# Patient Record
Sex: Female | Born: 1960 | Race: White | Hispanic: No | Marital: Single | Smoking: Current some day smoker
Health system: Southern US, Community
[De-identification: ages and names within clinical notes are randomized; demographics above are authoritative.]

## PROBLEM LIST (undated history)

## (undated) DIAGNOSIS — C801 Malignant (primary) neoplasm, unspecified: Secondary | ICD-10-CM

## (undated) HISTORY — PX: OTHER SURGICAL HISTORY: SHX169

---

## 2015-02-27 ENCOUNTER — Inpatient Hospital Stay (HOSPITAL_COMMUNITY): Payer: Medicaid Other

## 2015-02-27 ENCOUNTER — Inpatient Hospital Stay (HOSPITAL_COMMUNITY): Payer: Medicaid Other | Admitting: Anesthesiology

## 2015-02-27 ENCOUNTER — Emergency Department (HOSPITAL_COMMUNITY): Payer: Medicaid Other

## 2015-02-27 ENCOUNTER — Inpatient Hospital Stay (HOSPITAL_COMMUNITY)
Admission: EM | Admit: 2015-02-27 | Discharge: 2015-03-09 | DRG: 871 | Disposition: A | Discharge status: Home Health | Payer: Medicaid Other | Attending: Internal Medicine | Admitting: Internal Medicine

## 2015-02-27 ENCOUNTER — Encounter (HOSPITAL_COMMUNITY): Payer: Self-pay | Admitting: Emergency Medicine

## 2015-02-27 DIAGNOSIS — N179 Acute kidney failure, unspecified: Secondary | ICD-10-CM | POA: Diagnosis present

## 2015-02-27 DIAGNOSIS — A419 Sepsis, unspecified organism: Principal | ICD-10-CM | POA: Diagnosis present

## 2015-02-27 DIAGNOSIS — F1910 Other psychoactive substance abuse, uncomplicated: Secondary | ICD-10-CM | POA: Diagnosis present

## 2015-02-27 DIAGNOSIS — J90 Pleural effusion, not elsewhere classified: Secondary | ICD-10-CM | POA: Diagnosis present

## 2015-02-27 DIAGNOSIS — D696 Thrombocytopenia, unspecified: Secondary | ICD-10-CM | POA: Diagnosis present

## 2015-02-27 DIAGNOSIS — T501X5A Adverse effect of loop [high-ceiling] diuretics, initial encounter: Secondary | ICD-10-CM | POA: Diagnosis not present

## 2015-02-27 DIAGNOSIS — L8992 Pressure ulcer of unspecified site, stage 2: Secondary | ICD-10-CM | POA: Diagnosis present

## 2015-02-27 DIAGNOSIS — J154 Pneumonia due to other streptococci: Secondary | ICD-10-CM | POA: Diagnosis present

## 2015-02-27 DIAGNOSIS — C8333 Diffuse large B-cell lymphoma, intra-abdominal lymph nodes: Secondary | ICD-10-CM | POA: Diagnosis present

## 2015-02-27 DIAGNOSIS — B1920 Unspecified viral hepatitis C without hepatic coma: Secondary | ICD-10-CM | POA: Diagnosis present

## 2015-02-27 DIAGNOSIS — L89152 Pressure ulcer of sacral region, stage 2: Secondary | ICD-10-CM | POA: Diagnosis present

## 2015-02-27 DIAGNOSIS — R6521 Severe sepsis with septic shock: Secondary | ICD-10-CM | POA: Diagnosis present

## 2015-02-27 DIAGNOSIS — C8590 Non-Hodgkin lymphoma, unspecified, unspecified site: Secondary | ICD-10-CM

## 2015-02-27 DIAGNOSIS — D638 Anemia in other chronic diseases classified elsewhere: Secondary | ICD-10-CM | POA: Diagnosis present

## 2015-02-27 DIAGNOSIS — F1120 Opioid dependence, uncomplicated: Secondary | ICD-10-CM | POA: Diagnosis present

## 2015-02-27 DIAGNOSIS — J9601 Acute respiratory failure with hypoxia: Secondary | ICD-10-CM | POA: Diagnosis present

## 2015-02-27 DIAGNOSIS — D709 Neutropenia, unspecified: Secondary | ICD-10-CM | POA: Diagnosis present

## 2015-02-27 DIAGNOSIS — C8330 Diffuse large B-cell lymphoma, unspecified site: Secondary | ICD-10-CM | POA: Diagnosis not present

## 2015-02-27 DIAGNOSIS — R188 Other ascites: Secondary | ICD-10-CM | POA: Diagnosis present

## 2015-02-27 DIAGNOSIS — R579 Shock, unspecified: Secondary | ICD-10-CM | POA: Diagnosis present

## 2015-02-27 DIAGNOSIS — J9600 Acute respiratory failure, unspecified whether with hypoxia or hypercapnia: Secondary | ICD-10-CM

## 2015-02-27 DIAGNOSIS — R0600 Dyspnea, unspecified: Secondary | ICD-10-CM

## 2015-02-27 DIAGNOSIS — R0602 Shortness of breath: Secondary | ICD-10-CM

## 2015-02-27 DIAGNOSIS — J189 Pneumonia, unspecified organism: Secondary | ICD-10-CM

## 2015-02-27 DIAGNOSIS — E2740 Unspecified adrenocortical insufficiency: Secondary | ICD-10-CM | POA: Diagnosis present

## 2015-02-27 DIAGNOSIS — Y95 Nosocomial condition: Secondary | ICD-10-CM | POA: Diagnosis present

## 2015-02-27 DIAGNOSIS — Z9889 Other specified postprocedural states: Secondary | ICD-10-CM

## 2015-02-27 DIAGNOSIS — R0603 Acute respiratory distress: Secondary | ICD-10-CM

## 2015-02-27 DIAGNOSIS — L8990 Pressure ulcer of unspecified site, unspecified stage: Secondary | ICD-10-CM

## 2015-02-27 DIAGNOSIS — N19 Unspecified kidney failure: Secondary | ICD-10-CM | POA: Diagnosis not present

## 2015-02-27 DIAGNOSIS — D630 Anemia in neoplastic disease: Secondary | ICD-10-CM | POA: Diagnosis present

## 2015-02-27 DIAGNOSIS — F17210 Nicotine dependence, cigarettes, uncomplicated: Secondary | ICD-10-CM | POA: Diagnosis present

## 2015-02-27 DIAGNOSIS — E874 Mixed disorder of acid-base balance: Secondary | ICD-10-CM | POA: Diagnosis present

## 2015-02-27 DIAGNOSIS — R52 Pain, unspecified: Secondary | ICD-10-CM

## 2015-02-27 DIAGNOSIS — F1190 Opioid use, unspecified, uncomplicated: Secondary | ICD-10-CM | POA: Diagnosis present

## 2015-02-27 DIAGNOSIS — R509 Fever, unspecified: Secondary | ICD-10-CM

## 2015-02-27 DIAGNOSIS — E876 Hypokalemia: Secondary | ICD-10-CM | POA: Diagnosis not present

## 2015-02-27 DIAGNOSIS — F1110 Opioid abuse, uncomplicated: Secondary | ICD-10-CM | POA: Diagnosis not present

## 2015-02-27 DIAGNOSIS — J9690 Respiratory failure, unspecified, unspecified whether with hypoxia or hypercapnia: Secondary | ICD-10-CM

## 2015-02-27 DIAGNOSIS — L89322 Pressure ulcer of left buttock, stage 2: Secondary | ICD-10-CM | POA: Diagnosis present

## 2015-02-27 DIAGNOSIS — R109 Unspecified abdominal pain: Secondary | ICD-10-CM | POA: Diagnosis present

## 2015-02-27 HISTORY — DX: Malignant (primary) neoplasm, unspecified: C80.1

## 2015-02-27 LAB — BLOOD GAS, ARTERIAL
Acid-base deficit: 7.4 mmol/L — ABNORMAL HIGH (ref 0.0–2.0)
Acid-base deficit: 6.7 mmol/L — ABNORMAL HIGH (ref 0.0–2.0)
Bicarbonate: 14.2 mEq/L — ABNORMAL LOW (ref 20.0–24.0)
Bicarbonate: 16.7 mEq/L — ABNORMAL LOW (ref 20.0–24.0)
DRAWN BY: 308601
DRAWN BY: 295031
FIO2: 1
FIO2: 1
O2 SAT: 98.5 %
O2 Saturation: 98.9 %
PATIENT TEMPERATURE: 98.6
PCO2 ART: 19.3 mmHg — AB (ref 35.0–45.0)
PCO2 ART: 28.4 mmHg — AB (ref 35.0–45.0)
PEEP: 8 cmH2O
PH ART: 7.48 — AB (ref 7.350–7.450)
PH ART: 7.388 (ref 7.350–7.450)
Patient temperature: 98.6
RATE: 24 resp/min
TCO2: 12.7 mmol/L (ref 0–100)
TCO2: 15.3 mmol/L (ref 0–100)
VT: 500 mL
pO2, Arterial: 140 mmHg — ABNORMAL HIGH (ref 80.0–100.0)
pO2, Arterial: 147 mmHg — ABNORMAL HIGH (ref 80.0–100.0)

## 2015-02-27 LAB — LACTIC ACID, PLASMA
LACTIC ACID, VENOUS: 2.9 mmol/L — AB (ref 0.5–2.0)
LACTIC ACID, VENOUS: 3.7 mmol/L — AB (ref 0.5–2.0)
Lactic Acid, Venous: 9.3 mmol/L (ref 0.5–2.0)

## 2015-02-27 LAB — STREP PNEUMONIAE URINARY ANTIGEN: Strep Pneumo Urinary Antigen: POSITIVE — AB

## 2015-02-27 LAB — BASIC METABOLIC PANEL
ANION GAP: 12 (ref 5–15)
BUN: 21 mg/dL — ABNORMAL HIGH (ref 6–20)
CO2: 16 mmol/L — AB (ref 22–32)
Calcium: 6.6 mg/dL — ABNORMAL LOW (ref 8.9–10.3)
Chloride: 107 mmol/L (ref 101–111)
Creatinine, Ser: 0.99 mg/dL (ref 0.44–1.00)
GLUCOSE: 116 mg/dL — AB (ref 65–99)
POTASSIUM: 2.6 mmol/L — AB (ref 3.5–5.1)
Sodium: 135 mmol/L (ref 135–145)

## 2015-02-27 LAB — URINE MICROSCOPIC-ADD ON

## 2015-02-27 LAB — URINALYSIS, ROUTINE W REFLEX MICROSCOPIC
Glucose, UA: NEGATIVE mg/dL
KETONES UR: NEGATIVE mg/dL
LEUKOCYTES UA: NEGATIVE
NITRITE: NEGATIVE
PROTEIN: 30 mg/dL — AB
Specific Gravity, Urine: 1.015 (ref 1.005–1.030)
UROBILINOGEN UA: 1 mg/dL (ref 0.0–1.0)
pH: 5.5 (ref 5.0–8.0)

## 2015-02-27 LAB — RAPID URINE DRUG SCREEN, HOSP PERFORMED
AMPHETAMINES: NOT DETECTED
BENZODIAZEPINES: POSITIVE — AB
Barbiturates: NOT DETECTED
COCAINE: NOT DETECTED
OPIATES: NOT DETECTED
Tetrahydrocannabinol: NOT DETECTED

## 2015-02-27 LAB — COMPREHENSIVE METABOLIC PANEL
ALBUMIN: 2.7 g/dL — AB (ref 3.5–5.0)
ALK PHOS: 66 U/L (ref 38–126)
ALT: 21 U/L (ref 14–54)
ANION GAP: 21 — AB (ref 5–15)
AST: 47 U/L — ABNORMAL HIGH (ref 15–41)
BILIRUBIN TOTAL: 1 mg/dL (ref 0.3–1.2)
BUN: 22 mg/dL — ABNORMAL HIGH (ref 6–20)
CALCIUM: 8.2 mg/dL — AB (ref 8.9–10.3)
CO2: 15 mmol/L — AB (ref 22–32)
CREATININE: 1.88 mg/dL — AB (ref 0.44–1.00)
Chloride: 100 mmol/L — ABNORMAL LOW (ref 101–111)
GFR calc non Af Amer: 30 mL/min — ABNORMAL LOW (ref >60)
GFR, EST AFRICAN AMERICAN: 34 mL/min — AB (ref >60)
GLUCOSE: 119 mg/dL — AB (ref 65–99)
Potassium: 3.1 mmol/L — ABNORMAL LOW (ref 3.5–5.1)
SODIUM: 136 mmol/L (ref 135–145)
TOTAL PROTEIN: 5.1 g/dL — AB (ref 6.5–8.1)

## 2015-02-27 LAB — LIPASE, BLOOD: Lipase: <10 U/L — ABNORMAL LOW (ref 22–51)

## 2015-02-27 LAB — CARBOXYHEMOGLOBIN
Carboxyhemoglobin: 0.8 % (ref 0.5–1.5)
Carboxyhemoglobin: 1 % (ref 0.5–1.5)
METHEMOGLOBIN: 0.9 % (ref 0.0–1.5)
Methemoglobin: 0.7 % (ref 0.0–1.5)
O2 SAT: 60.2 %
O2 Saturation: 75.8 %
TOTAL HEMOGLOBIN: 11.2 g/dL — AB (ref 12.0–16.0)
TOTAL HEMOGLOBIN: 10.1 g/dL — AB (ref 12.0–16.0)

## 2015-02-27 LAB — PROTIME-INR
INR: 1.36 (ref 0.00–1.49)
Prothrombin Time: 16.9 seconds — ABNORMAL HIGH (ref 11.6–15.2)

## 2015-02-27 LAB — CBC WITH DIFFERENTIAL/PLATELET
BASOS PCT: 1 %
Basophils Absolute: 0 10*3/uL (ref 0.0–0.1)
EOS ABS: 0 10*3/uL (ref 0.0–0.7)
Eosinophils Relative: 2 %
HEMATOCRIT: 36.3 % (ref 36.0–46.0)
HEMOGLOBIN: 11.5 g/dL — AB (ref 12.0–15.0)
LYMPHS PCT: 85 %
Lymphs Abs: 0.8 10*3/uL (ref 0.7–4.0)
MCH: 26.7 pg (ref 26.0–34.0)
MCHC: 31.7 g/dL (ref 30.0–36.0)
MCV: 84.2 fL (ref 78.0–100.0)
MONOS PCT: 10 %
Monocytes Absolute: 0.1 10*3/uL (ref 0.1–1.0)
NEUTROS ABS: 0 10*3/uL — AB (ref 1.7–7.7)
NEUTROS PCT: 2 %
Platelets: 112 10*3/uL — ABNORMAL LOW (ref 150–400)
RBC: 4.31 MIL/uL (ref 3.87–5.11)
RDW: 15.4 % (ref 11.5–15.5)
WBC: 0.9 10*3/uL — CL (ref 4.0–10.5)

## 2015-02-27 LAB — TYPE AND SCREEN
ABO/RH(D): A POS
ANTIBODY SCREEN: NEGATIVE

## 2015-02-27 LAB — PROCALCITONIN: PROCALCITONIN: 35.35 ng/mL

## 2015-02-27 LAB — APTT: aPTT: 28 seconds (ref 24–37)

## 2015-02-27 LAB — GLUCOSE, CAPILLARY
GLUCOSE-CAPILLARY: 138 mg/dL — AB (ref 65–99)
GLUCOSE-CAPILLARY: 107 mg/dL — AB (ref 65–99)
Glucose-Capillary: 138 mg/dL — ABNORMAL HIGH (ref 65–99)

## 2015-02-27 LAB — CORTISOL: Cortisol, Plasma: 69.2 ug/dL

## 2015-02-27 LAB — ABO/RH: ABO/RH(D): A POS

## 2015-02-27 LAB — PATHOLOGIST SMEAR REVIEW

## 2015-02-27 LAB — PHOSPHORUS: PHOSPHORUS: 3.7 mg/dL (ref 2.5–4.6)

## 2015-02-27 LAB — TROPONIN I: Troponin I: 0.04 ng/mL — ABNORMAL HIGH (ref <0.031)

## 2015-02-27 LAB — BRAIN NATRIURETIC PEPTIDE: B NATRIURETIC PEPTIDE 5: 176.5 pg/mL — AB (ref 0.0–100.0)

## 2015-02-27 LAB — MRSA PCR SCREENING: MRSA BY PCR: NEGATIVE

## 2015-02-27 LAB — MAGNESIUM: Magnesium: 1.2 mg/dL — ABNORMAL LOW (ref 1.7–2.4)

## 2015-02-27 LAB — AMYLASE: AMYLASE: 31 U/L (ref 28–100)

## 2015-02-27 MED ORDER — POTASSIUM CHLORIDE 10 MEQ/100ML IV SOLN
10.0000 meq | INTRAVENOUS | Status: AC
  Administered 2015-02-27 – 2015-02-28 (×4): 10 meq via INTRAVENOUS
  Filled 2015-02-27 (×4): qty 100

## 2015-02-27 MED ORDER — NOREPINEPHRINE BITARTRATE 1 MG/ML IV SOLN
0.0000 ug/min | INTRAVENOUS | Status: DC
  Filled 2015-02-27 (×2): qty 4

## 2015-02-27 MED ORDER — ETOMIDATE 2 MG/ML IV SOLN
INTRAVENOUS | Status: AC
  Administered 2015-02-27: 20 mg
  Filled 2015-02-27: qty 20

## 2015-02-27 MED ORDER — FENTANYL BOLUS VIA INFUSION
50.0000 ug | INTRAVENOUS | Status: DC | PRN
  Filled 2015-02-27: qty 50

## 2015-02-27 MED ORDER — MIDAZOLAM HCL 2 MG/2ML IJ SOLN
2.0000 mg | INTRAMUSCULAR | Status: DC | PRN
  Administered 2015-02-27 – 2015-02-28 (×3): 2 mg via INTRAVENOUS
  Filled 2015-02-27 (×4): qty 2

## 2015-02-27 MED ORDER — SUCCINYLCHOLINE CHLORIDE 20 MG/ML IJ SOLN
INTRAMUSCULAR | Status: AC
  Filled 2015-02-27: qty 1

## 2015-02-27 MED ORDER — DOBUTAMINE IN D5W 4-5 MG/ML-% IV SOLN
5.0000 ug/kg/min | INTRAVENOUS | Status: DC
  Administered 2015-02-27: 5 ug/kg/min via INTRAVENOUS
  Filled 2015-02-27: qty 250

## 2015-02-27 MED ORDER — HYDROCORTISONE NA SUCCINATE PF 100 MG IJ SOLR
100.0000 mg | Freq: Three times a day (TID) | INTRAMUSCULAR | Status: DC
  Administered 2015-02-27 – 2015-02-28 (×4): 100 mg via INTRAVENOUS
  Filled 2015-02-27 (×4): qty 2

## 2015-02-27 MED ORDER — SODIUM CHLORIDE 0.9 % IV BOLUS (SEPSIS)
1000.0000 mL | Freq: Once | INTRAVENOUS | Status: AC
  Administered 2015-02-27: 1000 mL via INTRAVENOUS

## 2015-02-27 MED ORDER — MIDAZOLAM HCL 2 MG/2ML IJ SOLN: 2.0000 mg | INTRAMUSCULAR | Status: DC | PRN

## 2015-02-27 MED ORDER — MIDAZOLAM HCL 2 MG/2ML IJ SOLN
2.0000 mg | Freq: Once | INTRAMUSCULAR | Status: AC
  Administered 2015-02-27: 2 mg via INTRAVENOUS
  Filled 2015-02-27: qty 2

## 2015-02-27 MED ORDER — NOREPINEPHRINE BITARTRATE 1 MG/ML IV SOLN
0.0000 ug/min | Freq: Once | INTRAVENOUS | Status: AC
  Administered 2015-02-27: 2 ug/min via INTRAVENOUS
  Filled 2015-02-27: qty 4

## 2015-02-27 MED ORDER — SODIUM CHLORIDE 0.9 % IV BOLUS (SEPSIS)
2000.0000 mL | Freq: Once | INTRAVENOUS | Status: AC
  Administered 2015-02-27: 2000 mL via INTRAVENOUS

## 2015-02-27 MED ORDER — LIP MEDEX EX OINT
TOPICAL_OINTMENT | CUTANEOUS | Status: AC
  Administered 2015-02-27: 20:00:00
  Filled 2015-02-27: qty 7

## 2015-02-27 MED ORDER — PANTOPRAZOLE SODIUM 40 MG IV SOLR
40.0000 mg | Freq: Every day | INTRAVENOUS | Status: DC
Start: 2015-02-27 — End: 2015-03-06
  Administered 2015-02-27 – 2015-03-05 (×7): 40 mg via INTRAVENOUS
  Filled 2015-02-27 (×7): qty 40

## 2015-02-27 MED ORDER — LIDOCAINE HCL (CARDIAC) 20 MG/ML IV SOLN
INTRAVENOUS | Status: AC
  Filled 2015-02-27: qty 5

## 2015-02-27 MED ORDER — ONDANSETRON HCL 4 MG/2ML IJ SOLN
4.0000 mg | Freq: Four times a day (QID) | INTRAMUSCULAR | Status: DC | PRN
  Administered 2015-02-28: 4 mg via INTRAVENOUS
  Filled 2015-02-27 (×2): qty 2

## 2015-02-27 MED ORDER — FENTANYL CITRATE (PF) 2500 MCG/50ML IJ SOLN
25.0000 ug/h | INTRAMUSCULAR | Status: DC
  Administered 2015-02-27: 100 ug/h via INTRAVENOUS
  Administered 2015-02-28: 375 ug/h via INTRAVENOUS
  Filled 2015-02-27 (×4): qty 50

## 2015-02-27 MED ORDER — SODIUM CHLORIDE 0.9 % IV SOLN
25.0000 ug/h | INTRAVENOUS | Status: DC
  Administered 2015-02-27: 100 ug/h via INTRAVENOUS
  Administered 2015-02-27: 50 ug/h via INTRAVENOUS
  Filled 2015-02-27: qty 50

## 2015-02-27 MED ORDER — PIPERACILLIN-TAZOBACTAM 3.375 G IVPB
3.3750 g | Freq: Once | INTRAVENOUS | Status: AC
  Administered 2015-02-27: 3.375 g via INTRAVENOUS
  Filled 2015-02-27: qty 50

## 2015-02-27 MED ORDER — VASOPRESSIN 20 UNIT/ML IV SOLN
0.0300 [IU]/min | INTRAVENOUS | Status: DC
  Administered 2015-02-27: 0.03 [IU]/min via INTRAVENOUS
  Filled 2015-02-27: qty 2

## 2015-02-27 MED ORDER — CHLORHEXIDINE GLUCONATE 0.12% ORAL RINSE (MEDLINE KIT)
15.0000 mL | Freq: Two times a day (BID) | OROMUCOSAL | Status: DC
  Administered 2015-02-27: 15 mL via OROMUCOSAL

## 2015-02-27 MED ORDER — VANCOMYCIN HCL IN DEXTROSE 1-5 GM/200ML-% IV SOLN
1000.0000 mg | INTRAVENOUS | Status: DC
Start: 2015-02-28 — End: 2015-02-28
  Administered 2015-02-28: 1000 mg via INTRAVENOUS
  Filled 2015-02-27: qty 200

## 2015-02-27 MED ORDER — ROCURONIUM BROMIDE 50 MG/5ML IV SOLN
INTRAVENOUS | Status: AC
  Administered 2015-02-27: 10 mg
  Filled 2015-02-27: qty 2

## 2015-02-27 MED ORDER — FENTANYL CITRATE (PF) 100 MCG/2ML IJ SOLN
200.0000 ug | Freq: Once | INTRAMUSCULAR | Status: AC
  Administered 2015-02-27: 200 ug via INTRAVENOUS
  Filled 2015-02-27: qty 4

## 2015-02-27 MED ORDER — ANTISEPTIC ORAL RINSE SOLUTION (CORINZ)
7.0000 mL | Freq: Four times a day (QID) | OROMUCOSAL | Status: DC
  Administered 2015-02-27: 7 mL via OROMUCOSAL

## 2015-02-27 MED ORDER — PIPERACILLIN-TAZOBACTAM 3.375 G IVPB
3.3750 g | Freq: Three times a day (TID) | INTRAVENOUS | Status: DC
Start: 2015-02-27 — End: 2015-03-06
  Administered 2015-02-27 – 2015-03-06 (×21): 3.375 g via INTRAVENOUS
  Filled 2015-02-27 (×19): qty 50

## 2015-02-27 MED ORDER — DEXTROSE 5 % IV SOLN
0.0000 ug/min | INTRAVENOUS | Status: DC
  Administered 2015-02-27: 12 ug/min via INTRAVENOUS
  Administered 2015-02-28: 7 ug/min via INTRAVENOUS
  Filled 2015-02-27 (×2): qty 16

## 2015-02-27 MED ORDER — HEPARIN SODIUM (PORCINE) 5000 UNIT/ML IJ SOLN
5000.0000 [IU] | Freq: Three times a day (TID) | INTRAMUSCULAR | Status: DC
  Administered 2015-02-27 – 2015-02-28 (×3): 5000 [IU] via SUBCUTANEOUS
  Filled 2015-02-27 (×6): qty 1

## 2015-02-27 MED ORDER — VANCOMYCIN HCL IN DEXTROSE 1-5 GM/200ML-% IV SOLN
1000.0000 mg | Freq: Once | INTRAVENOUS | Status: AC
  Administered 2015-02-27: 1000 mg via INTRAVENOUS
  Filled 2015-02-27: qty 200

## 2015-02-27 MED ORDER — FENTANYL CITRATE (PF) 100 MCG/2ML IJ SOLN
50.0000 ug | Freq: Once | INTRAMUSCULAR | Status: AC
  Administered 2015-02-27: 50 ug via INTRAVENOUS

## 2015-02-27 NOTE — Progress Notes (Signed)
[place] Progress Note Patient Name: Joan Hoover DOB: 1960-07-09 MRN: [id number]   Date of Service  02/27/2015  HPI/Events of Note  Multiple issues: 1. K+ this AM = 3.1 and not replaced and 2. CVP = 7.  eICU Interventions  Will order: 1. BMP now. 2. Bolus with 0.9 NaCl 1 liter IV over 1 hour now.     Intervention Category Major Interventions: Shock - evaluation and management;Sepsis - evaluation and management;Hypotension - evaluation and management  Lysle Dingwall 02/27/2015, 8:57 PM

## 2015-02-27 NOTE — ED Notes (Signed)
Attempted blood draw with no success.     

## 2015-02-27 NOTE — Sedation Documentation (Signed)
Vocal cords visualized. Pt intubated.

## 2015-02-27 NOTE — Sedation Documentation (Signed)
Pt threw up. Pt suction. NP intubating.

## 2015-02-27 NOTE — ED Notes (Signed)
Pt presents from home via EMS for SOB. Daughter reports pt has been having recurrent pleural effusions with repeat thoracentesis. Pt A&O x4, c/o SOB, wet lung sounds.   20g L AC by EMS.

## 2015-02-27 NOTE — ED Notes (Signed)
NP at bedside discussing intubation with pt

## 2015-02-27 NOTE — Procedures (Signed)
Arterial Catheter Insertion Procedure Note Joan Hoover [id number] 1961-04-08  Procedure: Insertion of Arterial Catheter  Indications: Blood pressure monitoring and Frequent blood sampling  Procedure Details Consent: Risks of procedure as well as the alternatives and risks of each were explained to the (patient/caregiver).  Consent for procedure obtained. Time Out: Verified patient identification, verified procedure, site/side was marked, verified correct patient position, special equipment/implants available, medications/allergies/relevent history reviewed, required imaging and test results available.  Performed  Maximum sterile technique was used including antiseptics, cap, gloves, gown, hand hygiene, mask and sheet. Skin prep: Chlorhexidine; local anesthetic administered 20 gauge catheter was inserted into right femoral artery using the Seldinger technique.  Evaluation Blood flow good; BP tracing good. Complications: No apparent complications.   Joan Hoover 02/27/2015 [place] Failed by Joan Hoover. Joan Mould, MD, FACP Pgr: [facility]   Had firbotic tissue

## 2015-02-27 NOTE — Anesthesia Procedure Notes (Signed)
Anesthesia Procedure Note Called to 1231 for placement of A-line pt with restriction to right arm on arrival.  Attempt A-line with sterile procedure on left without success with doppler pre attempt.  Performed evaluation under ultrasound with noted small artery, advised staff to notify MD for evaluation of femoral placement.  Spoke with critical care MD and he would place femoral.  Pt VSS during procedure, advised RN of same.

## 2015-02-27 NOTE — Sedation Documentation (Signed)
Pt being BVM ventilated. Respiratory, NP and MDs at bedside. Pt verbalized consent and identified prior to procedure.

## 2015-02-27 NOTE — Progress Notes (Signed)
CRITICAL VALUE ALERT  Critical value received:  K 2.6  Date of notification:  02/27/2015  Time of notification:  2200  Critical value read back:Yes.    Nurse who received alert:  Kathie Rhodes, RN  MD notified (1st page):  Dr. Oletta Darter  Time of first page:  2205  MD notified (2nd page):  Time of second page:  Responding MD:  Dr. Oletta Darter  Time MD responded:  2206

## 2015-02-27 NOTE — Progress Notes (Addendum)
[facility] Progress Note Patient Name: Ikran Patman DOB: September 10, 1960 MRN: [id number]   Date of Service  02/27/2015  HPI/Events of Note  Coox = 60%. Hgb = 11.2, CVP = 18.0 and BP = 103/75. Anesthesia is attempting A-Line.  eICU Interventions  Will order: 1. Dobutamine IV infusion at 5 mcg/kg/min. 2. Repeat Coox in 1 hour.      Intervention Category Major Interventions: Shock - evaluation and management;Sepsis - evaluation and management  Sommer,Steven Eugene 02/27/2015, 4:14 PM

## 2015-02-27 NOTE — Progress Notes (Signed)
CRITICAL VALUE ALERT  Critical value received:  2.9  Date of notification:  02/27/2015  Time of notification:  1200  Critical value read back:Yes.    Nurse who received alert: Javier Glazier  MD notified (1st page):  Dr. Lake Bells  Time of first page:  1200  MD notified (2nd page):  Time of second page:  Responding MD:  Dr. Lake Bells  Time MD responded:  1200

## 2015-02-27 NOTE — Progress Notes (Signed)
CRITICAL VALUE ALERT  Critical value received:  scvo2  Date of notification:  02/27/2015  Time of notification:  0539  Critical value read back:Yes.    Nurse who received alert:  Javier Glazier  MD notified (1st page):  Sommers  Time of first page:  16  MD notified (2nd page):  Time of second page:  Responding MD:  Emmit Alexanders  Time MD responded:  7673  MD ordered dobutamine drip and repeat scvo2

## 2015-02-27 NOTE — Procedures (Signed)
Central Venous Catheter Insertion Procedure Note Joan Hoover [id number] 06-Sep-1960  Procedure: Insertion of Central Venous Catheter Indications: Assessment of intravascular volume, Drug and/or fluid administration and Frequent blood sampling  Procedure Details Consent: Risks of procedure as well as the alternatives and risks of each were explained to the (patient/caregiver).  Consent for procedure obtained. Time Out: Verified patient identification, verified procedure, site/side was marked, verified correct patient position, special equipment/implants available, medications/allergies/relevent history reviewed, required imaging and test results available.  Performed  Maximum sterile technique was used including antiseptics, cap, gloves, gown, hand hygiene, mask and sheet. Skin prep: Chlorhexidine; local anesthetic administered A antimicrobial bonded/coated triple lumen catheter was placed in the left internal jugular vein to 18 cm using the Seldinger technique, sutured in place.  Evaluation Blood flow good Complications: No apparent complications Patient did tolerate procedure well. Chest X-ray ordered to verify placement.  CXR: pending.    Procedure performed under direct supervision of Dr. Lake Bells and with ultrasound guidance for real time vessel cannulation.     Noe Gens, NP-C [facility] Pgr: [phone] or if no answer [phone] 02/27/2015, 12:07 PM

## 2015-02-27 NOTE — Procedures (Signed)
Intubation Procedure Note Joan Hoover [id number] 08/27/1960  Procedure: Intubation Indications: Respiratory insufficiency  Procedure Details Consent: Risks of procedure as well as the alternatives and risks of each were explained to the (patient/caregiver).  Consent for procedure obtained. Time Out: Verified patient identification, verified procedure, site/side was marked, verified correct patient position, special equipment/implants available, medications/allergies/relevent history reviewed, required imaging and test results available.  Performed  MAC and 3 Medications:  Fentanyl 200 mg Etomidate 20 mg Versed 2mg  NMB 60 mg rocuronium    Evaluation Hemodynamic Status: Transient hypotension treated with pressors; O2 sats: transiently fell during during procedure Patient's Current Condition: stable Complications: No apparent complications Patient did tolerate procedure well. Chest X-ray ordered to verify placement.  CXR: pending.   Richardson Landry Minor ACNP Joan Hoover PCCM Pager [phone] till 3 pm If no answer page [phone] 02/27/2015, 8:22 AM

## 2015-02-27 NOTE — Progress Notes (Signed)
[place] Progress Note Patient Name: Katalaya Beel DOB: January 24, 1961 MRN: [id number]   Date of Service  02/27/2015  HPI/Events of Note  K+ = 2.6 and Creatinine = 0.99.  eICU Interventions  Replete K+ and recheck K+ in AM.     Intervention Category Intermediate Interventions: Electrolyte abnormality - evaluation and management  Sommer,Steven Eugene 02/27/2015, 10:07 PM

## 2015-02-27 NOTE — Progress Notes (Signed)
Rt helped assisted MD with femoral a-line.

## 2015-02-27 NOTE — ED Notes (Signed)
Bed: RESA Expected date:  Expected time:  Means of arrival:  Comments: EMS 54 yo female SOB/ST 150/c-pap

## 2015-02-27 NOTE — ED Provider Notes (Addendum)
CSN: [id number]     Arrival date & time 02/27/15  9191 History   First MD Initiated Contact with Patient 02/27/15 0540     Chief Complaint  Patient presents with  . Shortness of Breath     Level V caveat: Respiratory distress  HPI Patient is brought to the emergency department from home via EMS on C Pap for severe hypoxia and respiratory distress.  Much of the patient's history is obtained from the patient's daughter who was available via phone.  Patient has a history of lymphoma and recently underwent chemotherapy several days ago and was complaining of severe shortness of breath tonight.  5 out of fever 102 rectally on arrival to the emergency department in severe respiratory distress with oxygen saturations in the high 70s to low 80s requiring nonrebreather.  On 15 L nonrebreather her O2 sats are 98%.  She denies abdominal pain but does report vomiting earlier.  She reports shortness of breath without cough.  Daughter reports recurrent pleural effusions with recent thoracentesis 2 weeks ago.  Patient denies unilateral leg swelling.  No history DVT or pulmonary embolism.   Past Medical History  Diagnosis Date  . Cancer     lymphoma    History reviewed. No pertinent past surgical history. No family history on file. Social History  Substance Use Topics  . Smoking status: Current Some Day Smoker  . Smokeless tobacco: None  . Alcohol Use: No   OB History    No data available     Review of Systems  Unable to perform ROS     Allergies  Review of patient's allergies indicates no known allergies.  Home Medications   Prior to Admission medications   Medication Sig Start Date End Date Taking? Authorizing Provider  allopurinol (ZYLOPRIM) 300 MG tablet Take 300 mg by mouth daily. 02/20/15  Yes Historical Provider, MD  HYDROcodone-acetaminophen (NORCO) 10-325 MG per tablet Take 1 tablet by mouth every 6 (six) hours as needed (for pain.). For pain. 01/15/15  Yes Historical Provider, MD   NON FORMULARY Chemo Dr. Regis Bill office   Yes Historical Provider, MD  ondansetron (ZOFRAN) 4 MG tablet Take 8 mg by mouth every 8 (eight) hours as needed. For nausea. 02/20/15  Yes Historical Provider, MD  Oxycodone HCl 10 MG TABS Take 10 mg by mouth every 3 (three) hours as needed. For pain. 02/14/15  Yes Historical Provider, MD  pantoprazole (PROTONIX) 40 MG tablet Take 40 mg by mouth 2 (two) times daily. 02/01/15  Yes Historical Provider, MD  polyethylene glycol (MIRALAX / GLYCOLAX) packet Take 17 g by mouth daily as needed. For constipation. 02/01/15  Yes Historical Provider, MD  predniSONE (DELTASONE) 50 MG tablet Take 100 mg by mouth as directed. 100 mg daily on days 2-5 of week of chemo only   Yes Historical Provider, MD  sennosides-docusate sodium (SENOKOT-S) 8.6-50 MG tablet Take 1 tablet by mouth daily.   Yes Historical Provider, MD   BP 53/31 mmHg  Pulse 123  Temp(Src) 100.3 F (37.9 C) (Axillary)  Resp 20  Ht 5\' 4"  (1.626 m)  Wt 127 lb (57.607 kg)  BMI 21.79 kg/m2  SpO2 92% Physical Exam  Constitutional: She appears well-developed. She appears distressed.  HENT:  Head: Normocephalic and atraumatic.  Eyes: EOM are normal.  Neck: Normal range of motion.  Cardiovascular: Normal rate, regular rhythm and normal heart sounds.   Pulmonary/Chest: Effort normal and breath sounds normal.  Abdominal: Soft. She exhibits no distension. There is no  tenderness.  Musculoskeletal: Normal range of motion.  Neurological: She is alert.  Skin: Skin is warm. She is diaphoretic.  Psychiatric:  Anxious.  Nursing note and vitals reviewed.   ED Course  Procedures (including critical care time)  CRITICAL CARE Performed by: Hoy Morn Total critical care time: 35 Critical care time was exclusive of separately billable procedures and treating other patients. Critical care was necessary to treat or prevent imminent or life-threatening deterioration. Critical care was time spent personally by  me on the following activities: development of treatment plan with patient and/or surrogate as well as nursing, discussions with consultants, evaluation of patient's response to treatment, examination of patient, obtaining history from patient or surrogate, ordering and performing treatments and interventions, ordering and review of laboratory studies, ordering and review of radiographic studies, pulse oximetry and re-evaluation of patient's condition.  Labs Review Labs Reviewed  CBC WITH DIFFERENTIAL/PLATELET - Abnormal; Notable for the following:    WBC 0.9 (*)    Hemoglobin 11.5 (*)    Platelets 112 (*)    Neutro Abs 0.0 (*)    All other components within normal limits  COMPREHENSIVE METABOLIC PANEL - Abnormal; Notable for the following:    Potassium 3.1 (*)    Chloride 100 (*)    CO2 15 (*)    Glucose, Bld 119 (*)    BUN 22 (*)    Creatinine, Ser 1.88 (*)    Calcium 8.2 (*)    Total Protein 5.1 (*)    Albumin 2.7 (*)    AST 47 (*)    GFR calc non Af Amer 30 (*)    GFR calc Af Amer 34 (*)    Anion gap 21 (*)    All other components within normal limits  TROPONIN I - Abnormal; Notable for the following:    Troponin I 0.04 (*)    All other components within normal limits  LACTIC ACID, PLASMA - Abnormal; Notable for the following:    Lactic Acid, Venous 9.3 (*)    All other components within normal limits  BLOOD GAS, ARTERIAL - Abnormal; Notable for the following:    pH, Arterial 7.480 (*)    pCO2 arterial 19.3 (*)    pO2, Arterial 140 (*)    Bicarbonate 14.2 (*)    Acid-base deficit 7.4 (*)    All other components within normal limits  BRAIN NATRIURETIC PEPTIDE - Abnormal; Notable for the following:    B Natriuretic Peptide 176.5 (*)    All other components within normal limits  CULTURE, BLOOD (ROUTINE X 2)  CULTURE, BLOOD (ROUTINE X 2)  URINE CULTURE  URINALYSIS, ROUTINE W REFLEX MICROSCOPIC (NOT AT [facility])    Imaging Review Dg Chest Portable 1 View  02/27/2015    CLINICAL DATA:  Shortness of breath  EXAM: PORTABLE CHEST - 1 VIEW  COMPARISON:  None.  FINDINGS: Cardiomegaly with pulmonary vascular congestion. No frank interstitial edema.  Mild patchy left lower lobe opacity, possibly atelectasis.  Small to moderate left pleural effusion.  No pneumothorax.  Right chest port terminates in the upper right atrium.  IMPRESSION: Cardiomegaly with pulmonary vascular congestion. No frank interstitial edema.  Small to moderate left pleural effusion.  Mild patchy left lower lobe opacity, possibly atelectasis.   Electronically Signed   By: Julian Hy M.D.   On: 02/27/2015 06:25   I have personally reviewed and evaluated these images and lab results as part of my medical decision-making.  ECG interpretation  Date: 02/27/2015  Rate:  122  Rhythm: normal sinus rhythm  QRS Axis: normal  Intervals: normal  ST/T Wave abnormalities: normal  Conduction Disutrbances: none  Narrative Interpretation:   Old EKG Reviewed: no prior ecg availble     MDM   Final diagnoses:  Acute respiratory failure with hypoxia  Septic shock  Neutropenia   Suspect neutropenia with septic shock.  IV resuscitation at this time.  Patient doing better on nonrebreather.  We'll plan on ICU admission.  Labs, culture, imaging, antibiotics.  7:08 AM  Additional IVFs now. Spoke with PCCM who will evaluate and admit. Request BIPAP at this time and levophed. Will continue to monitor closely  HR 122 BP74/55  PulseOx 98% (10L face mask)  Jola Schmidt, MD 02/27/15 [place], MD 02/27/15 [phone]

## 2015-02-27 NOTE — Progress Notes (Addendum)
1119 Cm spoke with Guerry Minors at [facility] to confirm pt is has been seen by Dr Amedeo Plenty early in 2016 and is active as a pt  updated mother at bedside entered pcp as Charline Bills and Steward Drone, MD as Hematology and Oncology   [phone]; Fax: [phone]     13 Cm spoke with 2 female family members at bedside in ICU rm 1231 who state pt had been seeing a family dr Dr Amedeo Plenty (female- Last seen possibly a few months ago) in [place] but Primarily has recently been seen by her oncologist Cm discussed [facility] stating pt would need to re establish care with them after pt had not been seen in 5 yrs      0933 Pt's medicaid card response hx indicates pcp is [facility] [place], [place] [phone] Cm spoke with Rodman Pickle at to find out that pt has not seen a provider in this office in 5 years since the office has been on [employer], therefore this pt is not an established active pt and would have to be re established as a pt Pt address in [place] is different from the one in [place] listed as [place] but in [place] listed as [address] [place].  NO pcp attached to pt for f/u care  CM entered note in pt d/c f/u section

## 2015-02-27 NOTE — Progress Notes (Signed)
ANTIBIOTIC CONSULT NOTE - INITIAL  Pharmacy Consult for Vancomycin, Zosyn Indication: r/o sepsis from abdominal infection  No Known Allergies  Patient Measurements: Height: 5\' 4"  (162.6 cm) Weight: 140 lb 3.4 oz (63.6 kg) IBW/kg (Calculated) : 54.7  Vital Signs: Temp: 100.6 F (38.1 C) (09/20 1200) Temp Source: Core (Comment) (09/20 1000) BP: 69/54 mmHg (09/20 1200) Pulse Rate: 109 (09/20 1200) Intake/Output from previous day:   Intake/Output from this shift: Total I/O In: 5774.1 [P.O.:14; I.V.:5760.1] Out: 165 [Urine:165]  Labs:  Recent Labs  02/27/15 0545  WBC 0.9*  HGB 11.5*  PLT 112*  CREATININE 1.88*   Estimated Creatinine Clearance: 30.2 mL/min (by C-G formula based on Cr of 1.88). No results for input(s): VANCOTROUGH, VANCOPEAK, VANCORANDOM, GENTTROUGH, GENTPEAK, GENTRANDOM, TOBRATROUGH, TOBRAPEAK, TOBRARND, AMIKACINPEAK, AMIKACINTROU, AMIKACIN in the last 72 hours.   Microbiology: Recent Results (from the past 720 hour(s))  MRSA PCR Screening     Status: None   Collection Time: 02/27/15  9:42 AM  Result Value Ref Range Status   MRSA by PCR NEGATIVE NEGATIVE Final    Comment:        The GeneXpert MRSA Assay (FDA approved for NASAL specimens only), is one component of a comprehensive MRSA colonization surveillance program. It is not intended to diagnose MRSA infection nor to guide or monitor treatment for MRSA infections.     Medical History: Past Medical History  Diagnosis Date  . Cancer     lymphoma     Medications:  Anti-infectives    Start     Dose/Rate Route Frequency Ordered Stop   02/28/15 0800  vancomycin (VANCOCIN) IVPB 1000 mg/200 mL premix     1,000 mg 200 mL/hr over 60 Minutes Intravenous Every 24 hours 02/27/15 1337     02/27/15 1400  piperacillin-tazobactam (ZOSYN) IVPB 3.375 g     3.375 g 12.5 mL/hr over 240 Minutes Intravenous 3 times per day 02/27/15 1335     02/27/15 0630  vancomycin (VANCOCIN) IVPB 1000 mg/200 mL premix      1,000 mg 200 mL/hr over 60 Minutes Intravenous  Once 02/27/15 0615 02/27/15 0844   02/27/15 0630  piperacillin-tazobactam (ZOSYN) IVPB 3.375 g     3.375 g 12.5 mL/hr over 240 Minutes Intravenous  Once 02/27/15 0615 02/27/15 0708     Assessment: 54 y.o. female with recent Dx lymphoma, now s/p PAC placement and 1st dose chemo, presents w/ nausea, abd pain.  Found to be profoundly hypoxic; admitted for protection of airway and treatment of suspected abdominal infection.  Pharmacy to dose vancomycin/Zosyn.   9/20 >> Zosyn >> 9/20 >> vancomycin >>    9/20 blood: IP 9/20 urine: IP 9/20 trach aspirate: S. pneumo UAg: POS Legionella UAg: IP  Today, 02/27/2015:  Tmax 100.6 WBC 0.9 (ANC 0) Renal: SCr elevated (baseline unknown); CrCl 30 CG PCT/LA elevated CXR: mild patchy LLL opacity, cannot r/o infection   Goal of Therapy:  Vancomycin trough level 15-20 mcg/ml  Eradication of infection Appropriate antibiotic dosing for indication and renal function  Plan:  Day 1 antibiotics Vancomycin 1000 mg IV now, then 1000 mg IV q24 hr Measure vancomycin trough levels at steady state as indicated Zosyn 3.375 g IV given once over 30 minutes, then every 8 hrs by 4-hr infusion  Follow clinical course, renal function, culture results as available  Follow for de-escalation of antibiotics and LOT   Reuel Boom, PharmD, BCPS Pager: [phone] 02/27/2015, 2:18 PM

## 2015-02-27 NOTE — H&P (Signed)
PULMONARY / CRITICAL CARE MEDICINE   Name: Joan Hoover MRN: [id number] DOB: 02/24/1961    ADMISSION DATE:  02/27/2015   REFERRING MD :  EDP  CHIEF COMPLAINT:  Abd pain  INITIAL PRESENTATION: Hypoxic  STUDIES:    SIGNIFICANT EVENTS:    HISTORY OF PRESENT ILLNESS:   54 yo female , life long smoker, reported to have been diagnosed with lymphoma 2 weeks ago but has a portacath  In place and received chemo 2 weeks ago in [place]  per Dr. Regis Bill. She presents to [facility] ED with complaints of nausea, abd pain and is found to be profoundly hypoxic despite 100% FIO2 and attempted NIMVS. Her PCO2 is 19, PH 7.48. PO2 146 on 100%. Lactic acid is >9 and her metabolic acidosis will most likely overwhelm her respiratory compensation.  She is on levophed for hypotension(stress steroids ordered stat) and fluid resuscitation has been instituted. She is a poor historian and her EMR are not available in EPIC.  Plan is to secure an airway, treat suspected infection with broad spectrum abx and check abd radiographs. We will seek pmh thru daughter. 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 We will also order 2 d echo for possible CM/valvular compromise.  PAST MEDICAL HISTORY :   has a past medical history of Cancer.  has no past surgical history on file. Prior to Admission medications   Medication Sig Start Date End Date Taking? Authorizing Provider  allopurinol (ZYLOPRIM) 300 MG tablet Take 300 mg by mouth daily. 02/20/15  Yes Historical Provider, MD  HYDROcodone-acetaminophen (NORCO) 10-325 MG per tablet Take 1 tablet by mouth every 6 (six) hours as needed (for pain.). For pain. 01/15/15  Yes Historical Provider, MD  NON FORMULARY Chemo Dr. Regis Bill office   Yes Historical Provider, MD  ondansetron (ZOFRAN) 4 MG tablet Take 8 mg by mouth every 8 (eight) hours as needed. For nausea. 02/20/15  Yes Historical Provider, MD  Oxycodone HCl 10 MG TABS Take 10 mg by mouth every 3 (three) hours  as needed. For pain. 02/14/15  Yes Historical Provider, MD  pantoprazole (PROTONIX) 40 MG tablet Take 40 mg by mouth 2 (two) times daily. 02/01/15  Yes Historical Provider, MD  polyethylene glycol (MIRALAX / GLYCOLAX) packet Take 17 g by mouth daily as needed. For constipation. 02/01/15  Yes Historical Provider, MD  predniSONE (DELTASONE) 50 MG tablet Take 100 mg by mouth as directed. 100 mg daily on days 2-5 of week of chemo only   Yes Historical Provider, MD  sennosides-docusate sodium (SENOKOT-S) 8.6-50 MG tablet Take 1 tablet by mouth daily.   Yes Historical Provider, MD   No Known Allergies  FAMILY HISTORY:  has no family status information on file.  SOCIAL HISTORY:  reports that she has been smoking.  She does not have any smokeless tobacco history on file. She reports that she does not drink alcohol or use illicit drugs.  REVIEW OF SYSTEMS:  na  SUBJECTIVE:   VITAL SIGNS: Temp:  [100.3 F (37.9 C)] 100.3 F (37.9 C) (09/20 0552) Pulse Rate:  [123-147] 124 (09/20 0700) Resp:  [14-43] 14 (09/20 0700) BP: (53-83)/(31-55) 83/53 mmHg (09/20 0700) SpO2:  [83 %-98 %] 95 % (09/20 0700) FiO2 (%):  [100 %] 100 % (09/20 0648) Weight:  [127 lb (57.607 kg)] 127 lb (57.607 kg) (09/20 5732) HEMODYNAMICS:   VENTILATOR SETTINGS: Vent Mode:  [-] BIPAP FiO2 (%):  [100 %] 100 % Set Rate:  [  10 bmp] 10 bmp PEEP:  [5 cmH20] 5 cmH20 INTAKE / OUTPUT: No intake or output data in the 24 hours ending 02/27/15 0732  PHYSICAL EXAMINATION: General:  Il appearing wf in distress Neuro: Intact, follows commands HEENT:  No JVD/LAN  Cardiovascular: HSR RRR Lungs:  Decreased airmovement Abdomen: +bs, diffue tenderness Musculoskeletal:  intact Skin:  Warm and dry  LABS:  CBC  Recent Labs Lab 02/27/15 0545  WBC 0.9*  HGB 11.5*  HCT 36.3  PLT 112*   Coag's No results for input(s): APTT, INR in the last 168 hours. BMET  Recent Labs Lab 02/27/15 0545  NA 136  K 3.1*  CL 100*  CO2 15*   BUN 22*  CREATININE 1.88*  GLUCOSE 119*   Electrolytes  Recent Labs Lab 02/27/15 0545  CALCIUM 8.2*   Sepsis Markers  Recent Labs Lab 02/27/15 0545  LATICACIDVEN 9.3*   ABG  Recent Labs Lab 02/27/15 0542  PHART 7.480*  PCO2ART 19.3*  PO2ART 140*   Liver Enzymes  Recent Labs Lab 02/27/15 0545  AST 47*  ALT 21  ALKPHOS 66  BILITOT 1.0  ALBUMIN 2.7*   Cardiac Enzymes  Recent Labs Lab 02/27/15 0545  TROPONINI 0.04*   Glucose No results for input(s): GLUCAP in the last 168 hours.  Imaging Dg Chest Portable 1 View  02/27/2015   CLINICAL DATA:  Shortness of breath  EXAM: PORTABLE CHEST - 1 VIEW  COMPARISON:  None.  FINDINGS: Cardiomegaly with pulmonary vascular congestion. No frank interstitial edema.  Mild patchy left lower lobe opacity, possibly atelectasis.  Small to moderate left pleural effusion.  No pneumothorax.  Right chest port terminates in the upper right atrium.  IMPRESSION: Cardiomegaly with pulmonary vascular congestion. No frank interstitial edema.  Small to moderate left pleural effusion.  Mild patchy left lower lobe opacity, possibly atelectasis.   Electronically Signed   By: Julian Hy M.D.   On: 02/27/2015 06:25     ASSESSMENT / PLAN:  PULMONARY OETT* 9/20>> A: Profound hypoxia Metabolic acidosis with resp compensation but wearing out Intubate for safety  Smoker P:   Intubation Sputum culture BD as needed  CARDIOVASCULAR CVLleft porta cath 76 weeks old A:  Shock in immuno comprised pt with recent chemo and on steroids  P:  Sepsis protocol Follow lactate  Levo and fluids plus stress steroids Check 2 d echo(iv drug use)  RENAL Lab Results  Component Value Date   CREATININE 1.88* 02/27/2015    A:  Renal insuff P:   Hydration Follow creatine Renal US if no improvement in creatine with fluids  GASTROINTESTINAL A:   Abd pain P:   Check flat plate of abd May need ct abd PPI Antiemetic   HEMATOLOGIC A:    Recent dx of lymphoma Neutropenia  P:  Obtain chemo records from [facility] May need hemonc consult  INFECTIOUS A:   Presumed infection, rule out abd source P:   BCx2 9/20>> UC 9/20>> Sputum 9/20>> Abx: vanc 9/20>> Pip-tazo 9/20>>  ENDOCRINE A:   Suspected adrenal insuff   P:   Stress steroids SSI if needed  NEUROLOGIC A:   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 Regis Bill. She presents to [facility] ED with complaints of nausea, abd pain and is found to be profoundly hypoxic despite 100% FIO2 and attempted NIMVS. Her PCO2 is 19, PH 7.48. PO2 146 on 100%. Lactic acid is >9 and her metabolic acidosis will most likely overwhelm her respiratory compensation.  She is on levophed for hypotension(stress steroids ordered stat) and fluid resuscitation has been instituted. She is a poor historian and her EMR are not available in EPIC.  Plan is to secure an airway, treat suspected infection with broad spectrum abx and check abd radiographs. We will seek pmh thru daughter. 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 We will also order 2 d echo for possible CM/valvular compromise.  Richardson Landry Minor ACNP [place] PCCM Pager [phone] till 3 pm If no answer page [phone] 02/27/2015, 7:50 AM      Attending:  I have seen and examined the patient with nurse practitioner/resident and agree with the note above.   Ms. Raus came to the ER today complaining of dyspnea and abdominal pain.  It's not clear that this pain is acute as she has had abdominal pain for several months.  She has a  history of lymphoma (per mother it is located to the belly primarily) and recently received chemotherapy.  On exam: Marked respiratory distress Lungs: crackles bilaterally GI: guarding somewhat, minimal bowel sounds Neuro: Awake, alert, talking  CXR: left effusion, atelectasis left base  Lactic acid of 9 noted WBC 0.9 AKI noted  Impression/Plan Septic shock> source uncertain, likely HCAP but worrisome for abdominal source.  CT abdomen/pelvis is pending> continue broad spectrum antibiotics, IVF, Levophed for MAP> 65; has received adequate fluid resuscitation and is volume replete but still hypotensive; follow lactic acid; place CVL  Acute respiratory failure with hypoxemia > treat as HCAP  Pleural effusion> may need thoracentesis if no clear source of infection identified, has had thora in the recent past  Mother updated bedside  My cc time 45 minutes  Roselie Awkward, MD [place] PCCM Pager: [phone] Cell: [phone] After 3pm or if no response, call [phone]

## 2015-02-27 NOTE — ED Notes (Signed)
Family at bedside. 

## 2015-02-27 NOTE — Progress Notes (Signed)
[facility] Progress Note Patient Name: Joan Hoover DOB: [birth year] MRN: [id number]   Date of Service  02/27/2015  HPI/Events of Note  Coox has improved to 76 s/p fluid and Dobutamine IV infusion.   eICU Interventions  Will order: 1. Lactic Acid at 5 AM.   Continue present management.      Intervention Category Major Interventions: Sepsis - evaluation and management;Shock - evaluation and management  Lysle Dingwall 02/27/2015, 6:58 PM

## 2015-02-27 NOTE — Progress Notes (Signed)
Two rt's attempted a-line and was unsuccessful. MD aware of no aline placement at this time.

## 2015-02-28 ENCOUNTER — Inpatient Hospital Stay (HOSPITAL_COMMUNITY): Payer: Medicaid Other

## 2015-02-28 DIAGNOSIS — R0600 Dyspnea, unspecified: Secondary | ICD-10-CM

## 2015-02-28 LAB — BLOOD GAS, ARTERIAL
Acid-base deficit: 4.9 mmol/L — ABNORMAL HIGH (ref 0.0–2.0)
Acid-base deficit: 5.6 mmol/L — ABNORMAL HIGH (ref 0.0–2.0)
Bicarbonate: 16.8 mEq/L — ABNORMAL LOW (ref 20.0–24.0)
Bicarbonate: 18.1 mEq/L — ABNORMAL LOW (ref 20.0–24.0)
DRAWN BY: 308601
DRAWN BY: 103701
FIO2: 0.4
FIO2: 0.4
MECHVT: 500 mL
O2 Saturation: 98.9 %
O2 Saturation: 95.9 %
PEEP/CPAP: 5 cmH2O
PEEP: 8 cmH2O
PRESSURE SUPPORT: 5 cmH2O
Patient temperature: 37.6
Patient temperature: 98.6
RATE: 25 resp/min
TCO2: 15.4 mmol/L (ref 0–100)
TCO2: 17.1 mmol/L (ref 0–100)
pCO2 arterial: 22 mmHg — ABNORMAL LOW (ref 35.0–45.0)
pCO2 arterial: 30.7 mmHg — ABNORMAL LOW (ref 35.0–45.0)
pH, Arterial: 7.497 — ABNORMAL HIGH (ref 7.350–7.450)
pH, Arterial: 7.389 (ref 7.350–7.450)
pO2, Arterial: 144 mmHg — ABNORMAL HIGH (ref 80.0–100.0)
pO2, Arterial: 94.1 mmHg (ref 80.0–100.0)

## 2015-02-28 LAB — LEGIONELLA ANTIGEN, URINE

## 2015-02-28 LAB — BASIC METABOLIC PANEL
ANION GAP: 10 (ref 5–15)
BUN: 20 mg/dL (ref 6–20)
CALCIUM: 6.7 mg/dL — AB (ref 8.9–10.3)
CO2: 17 mmol/L — ABNORMAL LOW (ref 22–32)
Chloride: 108 mmol/L (ref 101–111)
Creatinine, Ser: 0.99 mg/dL (ref 0.44–1.00)
GLUCOSE: 109 mg/dL — AB (ref 65–99)
Potassium: 3.2 mmol/L — ABNORMAL LOW (ref 3.5–5.1)
SODIUM: 135 mmol/L (ref 135–145)

## 2015-02-28 LAB — CBC WITH DIFFERENTIAL/PLATELET
BASOS ABS: 0 10*3/uL (ref 0.0–0.1)
Basophils Relative: 0 %
EOS ABS: 0 10*3/uL (ref 0.0–0.7)
Eosinophils Relative: 0 %
HCT: 28.9 % — ABNORMAL LOW (ref 36.0–46.0)
HEMOGLOBIN: 9.4 g/dL — AB (ref 12.0–15.0)
LYMPHS PCT: 15 %
Lymphs Abs: 0.1 10*3/uL — ABNORMAL LOW (ref 0.7–4.0)
MCH: 26.6 pg (ref 26.0–34.0)
MCHC: 32.5 g/dL (ref 30.0–36.0)
MCV: 81.6 fL (ref 78.0–100.0)
Monocytes Absolute: 0.2 10*3/uL (ref 0.1–1.0)
Monocytes Relative: 24 %
NEUTROS ABS: 0.5 10*3/uL — AB (ref 1.7–7.7)
NEUTROS PCT: 61 %
Platelets: 44 10*3/uL — ABNORMAL LOW (ref 150–400)
RBC: 3.54 MIL/uL — ABNORMAL LOW (ref 3.87–5.11)
RDW: 15.4 % (ref 11.5–15.5)
WBC: 0.8 10*3/uL — CL (ref 4.0–10.5)

## 2015-02-28 LAB — LACTIC ACID, PLASMA: LACTIC ACID, VENOUS: 2.3 mmol/L — AB (ref 0.5–2.0)

## 2015-02-28 LAB — GLUCOSE, CAPILLARY
Glucose-Capillary: 102 mg/dL — ABNORMAL HIGH (ref 65–99)
Glucose-Capillary: 117 mg/dL — ABNORMAL HIGH (ref 65–99)
Glucose-Capillary: 105 mg/dL — ABNORMAL HIGH (ref 65–99)

## 2015-02-28 LAB — MAGNESIUM
MAGNESIUM: 1.4 mg/dL — AB (ref 1.7–2.4)
MAGNESIUM: 2.8 mg/dL — AB (ref 1.7–2.4)

## 2015-02-28 LAB — URINE CULTURE: Culture: NO GROWTH

## 2015-02-28 LAB — PHOSPHORUS: PHOSPHORUS: 3 mg/dL (ref 2.5–4.6)

## 2015-02-28 MED ORDER — VITAL HIGH PROTEIN PO LIQD
1000.0000 mL | ORAL | Status: DC
  Filled 2015-02-28: qty 1000

## 2015-02-28 MED ORDER — FENTANYL CITRATE (PF) 100 MCG/2ML IJ SOLN
12.5000 ug | INTRAMUSCULAR | Status: DC | PRN
  Administered 2015-02-28 – 2015-03-06 (×27): 25 ug via INTRAVENOUS
  Filled 2015-02-28 (×27): qty 2

## 2015-02-28 MED ORDER — POTASSIUM CHLORIDE 20 MEQ/15ML (10%) PO SOLN
20.0000 meq | ORAL | Status: AC
  Administered 2015-02-28 (×2): 20 meq
  Filled 2015-02-28 (×2): qty 15

## 2015-02-28 MED ORDER — MAGNESIUM SULFATE 50 % IJ SOLN
6.0000 g | Freq: Once | INTRAMUSCULAR | Status: AC
  Administered 2015-02-28: 6 g via INTRAVENOUS
  Filled 2015-02-28: qty 10

## 2015-02-28 MED ORDER — VITAL 1.5 CAL PO LIQD
1000.0000 mL | ORAL | Status: DC
Start: 2015-02-28 — End: 2015-02-28
  Filled 2015-02-28: qty 1000

## 2015-02-28 MED ORDER — VANCOMYCIN HCL IN DEXTROSE 750-5 MG/150ML-% IV SOLN
750.0000 mg | Freq: Two times a day (BID) | INTRAVENOUS | Status: DC
  Administered 2015-02-28 – 2015-03-03 (×6): 750 mg via INTRAVENOUS
  Filled 2015-02-28 (×8): qty 150

## 2015-02-28 MED ORDER — METHADONE HCL 10 MG/ML PO CONC
10.0000 mg | Freq: Three times a day (TID) | ORAL | Status: DC
  Administered 2015-02-28 – 2015-03-09 (×17): 10 mg
  Filled 2015-02-28 (×23): qty 1

## 2015-02-28 MED ORDER — CETYLPYRIDINIUM CHLORIDE 0.05 % MT LIQD
7.0000 mL | Freq: Two times a day (BID) | OROMUCOSAL | Status: DC
  Administered 2015-03-02 – 2015-03-06 (×7): 7 mL via OROMUCOSAL

## 2015-02-28 MED ORDER — IPRATROPIUM-ALBUTEROL 0.5-2.5 (3) MG/3ML IN SOLN
3.0000 mL | Freq: Four times a day (QID) | RESPIRATORY_TRACT | Status: DC
  Administered 2015-02-28 – 2015-03-04 (×16): 3 mL via RESPIRATORY_TRACT
  Filled 2015-02-28 (×16): qty 3

## 2015-02-28 MED ORDER — FREE WATER: 100.0000 mL | Freq: Four times a day (QID) | Status: DC

## 2015-02-28 MED ORDER — HYDROCORTISONE NA SUCCINATE PF 100 MG IJ SOLR
50.0000 mg | Freq: Four times a day (QID) | INTRAMUSCULAR | Status: AC
  Administered 2015-02-28 – 2015-03-02 (×7): 50 mg via INTRAVENOUS
  Filled 2015-02-28 (×7): qty 2

## 2015-02-28 MED ORDER — PRO-STAT SUGAR FREE PO LIQD: 30.0000 mL | Freq: Every morning | ORAL | Status: DC

## 2015-02-28 MED ORDER — CHLORHEXIDINE GLUCONATE 0.12 % MT SOLN
15.0000 mL | Freq: Two times a day (BID) | OROMUCOSAL | Status: DC
  Administered 2015-03-01 – 2015-03-07 (×4): 15 mL via OROMUCOSAL
  Filled 2015-02-28 (×11): qty 15

## 2015-02-28 NOTE — Progress Notes (Signed)
  Echocardiogram 2D Echocardiogram has been performed.  Darlina Sicilian M 02/28/2015, 10:30 AM

## 2015-02-28 NOTE — Progress Notes (Addendum)
Initial Nutrition Assessment  DOCUMENTATION CODES:   Not applicable  INTERVENTION:  - Will order Vital 1.5 @ 45 mL/hr with 30 mL Prostat once/day which provides 1720 kcal, 73 grams protein, and 825 mL free water. Will also order 100 mL free water QID which will increase total free water to 1225 mL. - RD will continue to monitor for needs  NUTRITION DIAGNOSIS:   Inadequate oral intake related to inability to eat as evidenced by NPO status.  GOAL:   Patient will meet greater than or equal to 90% of their needs  MONITOR:   TF tolerance, Vent status, Weight trends, Labs, Skin, I & O's  REASON FOR ASSESSMENT:   Low Braden, Ventilator, Consult Enteral/tube feeding initiation and management  ASSESSMENT:   54 yo female , life long smoker, reported to have been diagnosed with lymphoma 2 weeks ago but has a portacath In place and received chemo 2 weeks ago in [place] per Dr. Regis Bill. She presents to [facility] ED with complaints of nausea, abd pain and is found to be profoundly hypoxic despite 100% FIO2 and attempted NIMVS. Her PCO2 is 19, PH 7.48. PO2 146 on 100%. Lactic acid is >9 and her metabolic acidosis will most likely overwhelm her respiratory compensation. She is on levophed for hypotension(stress steroids ordered stat) and fluid resuscitation has been instituted. She is a poor historian and her EMR are not available in EPIC. Plan is to secure an airway, treat suspected infection with broad spectrum abx and check abd radiographs.  RD to initiate and manage TF.  Patient is currently intubated on ventilator support MV: 8.8 L/min Temp (24hrs), Avg:99.8 F (37.7 C), Min:97.5 F (36.4 C), Max:101.1 F (38.4 C)  Propofol: none  Pt remains awake, alert despite high dose Fentanyl; noted hx of IV drug abuse. No family present to provide hx about weight trends or intakes PTA and no such information available in the chart. Noted recent hx of lymphoma and last chemo was 2 weeks PTA;  needs will need to be adjusted to account for this s/p extubation.  No muscle or fat wasting noted. Unable to meet needs. Will order TF as outlined above as OGT is in place. Medications reviewed. Labs reviewed; CBGs: 102-138 mg/dL, K: 3.2 mmol/L, Ca: 6.7 mg/dL, Mg: 1.4 mg/dL.  ADDENDUM: Drips: Fentanyl @ 375 mcg/hr, Levophed @ 3 mcg/min.    Diet Order:  Diet NPO time specified  Skin:    stage 2 pressure ulcers to sacrum: L x2, R, and medial  Last BM:  PTA  Height:   Ht Readings from Last 1 Encounters:  02/27/15 5\' 4"  (1.626 m)    Weight:   Wt Readings from Last 1 Encounters:  02/28/15 148 lb 13 oz (67.5 kg)    Ideal Body Weight:  54.54 kg (kg)  BMI:  Body mass index is 25.53 kg/(m^2).  Estimated Nutritional Needs:   Kcal:  0712  Protein:  81-102 grams  Fluid:  2 L/day  EDUCATION NEEDS:   No education needs identified at this time     Jarome Matin, RD, LDN Inpatient Clinical Dietitian Pager # [phone] After hours/weekend pager # [phone]

## 2015-02-28 NOTE — Progress Notes (Signed)
PULMONARY / CRITICAL CARE MEDICINE   Name: Joan Hoover MRN: [id number] DOB: Oct 29, 1960    ADMISSION DATE:  02/27/2015   REFERRING MD :  EDP  CHIEF COMPLAINT:  Abd pain  INITIAL PRESENTATION:  54 y/o female with lymphoma receiving chemotherapy was admitted on 9/21 with septic shock in setting of neutropenia.  STUDIES:  9/20 CT abdomen/pelv> large left and moderate R pleural effusion with adjacent airspace opacities, third spacing of fluid in mesentery, retroperitoneal and left pelvic adenopathy consistent with lymphoma, new endplate compression fractures, mild bowel wall thickening 9/21 Echo> pending  SIGNIFICANT EVENTS: 9/20 > admitted, SvO2 ~60%, started on dobutamine  SUBJECTIVE:  Started on dobutamine> SvO2 improved  VITAL SIGNS: Temp:  [98.2 F (36.8 C)-101.1 F (38.4 C)] 98.2 F (36.8 C) (09/21 0800) Pulse Rate:  [47-134] 83 (09/21 0800) Resp:  [0-32] 28 (09/21 0800) BP: (61-139)/(34-85) 139/76 mmHg (09/21 0800) SpO2:  [89 %-100 %] 98 % (09/21 0800) Arterial Line BP: (85-140)/(53-83) 134/72 mmHg (09/21 0800) FiO2 (%):  [40 %-100 %] 40 % (09/21 0600) Weight:  [140 lb 3.4 oz (63.6 kg)-148 lb 13 oz (67.5 kg)] 148 lb 13 oz (67.5 kg) (09/21 0500) HEMODYNAMICS: CVP:  [9 mmHg-18 mmHg] 13 mmHg VENTILATOR SETTINGS: Vent Mode:  [-] PRVC FiO2 (%):  [40 %-100 %] 40 % Set Rate:  [25 bmp] 25 bmp Vt Set:  [500 mL] 500 mL PEEP:  [5 cmH20-8 cmH20] 8 cmH20 Plateau Pressure:  [18 cmH20-21 cmH20] 19 cmH20 INTAKE / OUTPUT:  Intake/Output Summary (Last 24 hours) at 02/28/15 0807 Last data filed at 02/28/15 9470  Gross per 24 hour  Intake 8958.71 ml  Output   1280 ml  Net 7678.71 ml    PHYSICAL EXAMINATION:  Gen: awake on vent with fentanyl infusion running HENT: NCAT EOMi, ETT in place PULM: Wheezing bilaterally, diminished left base CV: RRR, no mgr GI: BS+, soft, nontender MSK: normal bulk and tone, SCD in place Neuro: awake, writing on notepad,  maew  LABS:  CBC  Recent Labs Lab 02/27/15 0545 02/28/15 0405  WBC 0.9* 0.8*  HGB 11.5* 9.4*  HCT 36.3 28.9*  PLT 112* 44*   Coag's  Recent Labs Lab 02/27/15 1102  APTT 28  INR 1.36   BMET  Recent Labs Lab 02/27/15 0545 02/27/15 2110 02/28/15 0405  NA 136 135 135  K 3.1* 2.6* 3.2*  CL 100* 107 108  CO2 15* 16* 17*  BUN 22* 21* 20  CREATININE 1.88* 0.99 0.99  GLUCOSE 119* 116* 109*   Electrolytes  Recent Labs Lab 02/27/15 0545 02/27/15 1102 02/27/15 2110 02/28/15 0405  CALCIUM 8.2*  --  6.6* 6.7*  MG  --  1.2*  --  1.4*  PHOS  --  3.7  --  3.0   Sepsis Markers  Recent Labs Lab 02/27/15 1102 02/27/15 1103 02/27/15 1422 02/28/15 0405  LATICACIDVEN  --  2.9* 3.7* 2.3*  PROCALCITON 35.35  --   --   --    ABG  Recent Labs Lab 02/27/15 0542 02/27/15 0930 02/28/15 0358  PHART 7.480* 7.388 7.497*  PCO2ART 19.3* 28.4* 22.0*  PO2ART 140* 147* 144*   Liver Enzymes  Recent Labs Lab 02/27/15 0545  AST 47*  ALT 21  ALKPHOS 66  BILITOT 1.0  ALBUMIN 2.7*   Cardiac Enzymes  Recent Labs Lab 02/27/15 0545  TROPONINI 0.04*   Glucose  Recent Labs Lab 02/27/15 1238 02/27/15 1735 02/27/15 2125 02/28/15 0653  GLUCAP 138* 138* 107* 102*  Imaging 9/21 CXR > large left effusion, compressive atelectasis, ETT and lines are in place  ASSESSMENT / PLAN:  PULMONARY OETT 9/20>> A: HCAP Pleural effusion > uncertain etiology Acute respiratory failure with hypoxemia> due to HCAP Respiratory alkalosis from over ventilation Smoker > wheezing on exam today P:   Decrease RR and repeat ABG PSV trial this morning Add bronchodilators Will attempt thoracentesis today Continue full vent support Repeat ABG post vent change  CARDIOVASCULAR CVL left IJ 9/21 >  R IJ porta cath 28 weeks old A:  Septic shock complicated by Adrenal insufficiency Also with ?cardiogenic shock as Coox low, could be related to sepsis Currently volume replete if  not volume up P:  Levophed for MAP > 65 Continue dobutamine until Echo report back Monitor CVP  RENAL Lab Results  Component Value Date   CREATININE 0.99 02/28/2015   CREATININE 0.99 02/27/2015   CREATININE 1.88* 02/27/2015    A:  AKI> improving Hypokalemia P:   Monitor BMET and UOP Replace electrolytes as needed Replacing KCL 9/21  GASTROINTESTINAL A:   Abd pain > family reports this is chronic, CT abdomen with ? Mild colitis but no acute abnormalities Ascites noted, hypoalbuminemia? Portal vein thrombus? 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

## 2015-02-28 NOTE — Care Management Note (Signed)
Case Management Note  Patient Details  Name: Joan Hoover MRN: [id number] Date of Birth: 1960/12/21  Subjective/Objective:             Sepsis and intubated       Action/Plan: Date:  Sept. 21, 2016 U.R. performed for needs and level of care. Will continue to follow for Case Management needs.  Velva Harman, RN, BSN, [place]   [phone] Expected Discharge Date:   (unknown)               Expected Discharge Plan:     In-House Referral:     Discharge planning Services     Post Acute Care Choice:    Choice offered to:     DME Arranged:    DME Agency:     HH Arranged:    [place] Agency:     Status of Service:     Medicare Important Message Given:    Date Medicare IM Given:    Medicare IM give by:    Date Additional Medicare IM Given:    Additional Medicare Important Message give by:     If discussed at [place] of Stay Meetings, dates discussed:    Additional Comments:  Leeroy Cha, RN 02/28/2015, 10:25 AM

## 2015-02-28 NOTE — Progress Notes (Signed)
Patient placed on 100% Fio2, suctioned for a large amount thick white sections with strong cough. Cuff deflated,adequate air flow felt, extubated to a 3lpm [place] . Patient able to speak clearly post extubation. HR 120<RR 19, BP 140/69. Will continue to monitor

## 2015-02-28 NOTE — Plan of Care (Signed)
Problem: Phase II Progression Outcomes Goal: Date pt extubated/weaned off vent Outcome: Completed/Met Date Met:  02/28/15 Extubated 9/21 at 1200

## 2015-02-28 NOTE — Progress Notes (Signed)
ANTIBIOTIC CONSULT NOTE - FOLLOW UP  Pharmacy Consult for Vancomycin, Zosyn Indication: r/o sepsis from abdominal infection  No Known Allergies  Patient Measurements: Height: 5\' 4"  (162.6 cm) Weight: 148 lb 13 oz (67.5 kg) IBW/kg (Calculated) : 54.7  Vital Signs: Temp: 97.2 F (36.2 C) (09/21 1100) Temp Source: Core (Comment) (09/21 0600) BP: 139/76 mmHg (09/21 0800) Pulse Rate: 99 (09/21 1100) Intake/Output from previous day: 09/20 0701 - 09/21 0700 In: 8958.7 [P.O.:14; I.V.:7224.7; NG/GT:90; IV Piggyback:1550] Out: 1280 [Urine:1280] Intake/Output from this shift: Total I/O In: 200 [IV Piggyback:200] Out: -   Labs:  Recent Labs  02/27/15 0545 02/27/15 2110 02/28/15 0405  WBC 0.9*  --  0.8*  HGB 11.5*  --  9.4*  PLT 112*  --  44*  CREATININE 1.88* 0.99 0.99   Estimated Creatinine Clearance: 62.8 mL/min (by C-G formula based on Cr of 0.99). No results for input(s): VANCOTROUGH, VANCOPEAK, VANCORANDOM, GENTTROUGH, GENTPEAK, GENTRANDOM, TOBRATROUGH, TOBRAPEAK, TOBRARND, AMIKACINPEAK, AMIKACINTROU, AMIKACIN in the last 72 hours.   Microbiology: Recent Results (from the past 720 hour(s))  Urine culture     Status: None   Collection Time: 02/27/15  9:41 AM  Result Value Ref Range Status   Specimen Description URINE, CATHETERIZED  Final   Special Requests NONE  Final   Culture   Final    NO GROWTH 1 DAY Performed at [facility]    Report Status 02/28/2015 FINAL  Final  MRSA PCR Screening     Status: None   Collection Time: 02/27/15  9:42 AM  Result Value Ref Range Status   MRSA by PCR NEGATIVE NEGATIVE Final    Comment:        The GeneXpert MRSA Assay (FDA approved for NASAL specimens only), is one component of a comprehensive MRSA colonization surveillance program. It is not intended to diagnose MRSA infection nor to guide or monitor treatment for MRSA infections.     Medical History: Past Medical History  Diagnosis Date  . Cancer    lymphoma     Medications:  Anti-infectives    Start     Dose/Rate Route Frequency Ordered Stop   02/28/15 2200  vancomycin (VANCOCIN) IVPB 750 mg/150 ml premix     750 mg 150 mL/hr over 60 Minutes Intravenous Every 12 hours 02/28/15 1153     02/28/15 0800  vancomycin (VANCOCIN) IVPB 1000 mg/200 mL premix  Status:  Discontinued     1,000 mg 200 mL/hr over 60 Minutes Intravenous Every 24 hours 02/27/15 1337 02/28/15 1153   02/27/15 1400  piperacillin-tazobactam (ZOSYN) IVPB 3.375 g     3.375 g 12.5 mL/hr over 240 Minutes Intravenous 3 times per day 02/27/15 1335     02/27/15 0630  vancomycin (VANCOCIN) IVPB 1000 mg/200 mL premix     1,000 mg 200 mL/hr over 60 Minutes Intravenous  Once 02/27/15 0615 02/27/15 0844   02/27/15 0630  piperacillin-tazobactam (ZOSYN) IVPB 3.375 g     3.375 g 12.5 mL/hr over 240 Minutes Intravenous  Once 02/27/15 0615 02/27/15 0708     Assessment: 54 y.o. female with recent Dx lymphoma, now s/p PAC placement and 1st dose chemo, presents w/ nausea, abd pain.  Found to be profoundly hypoxic; admitted for protection of airway and treatment of suspected abdominal infection.  Pharmacy to dose vancomycin/Zosyn.   9/20 >> Zosyn >> 9/20 >> vancomycin >>   9/20 blood: IP 9/20 urine: NGTD 9/20 trach aspirate: IP S. pneumo UAg: POS Legionella UAg: IP  Today, 02/28/2015:  Tmax Afebrile, fever on admin WBC 0.8, stable. (ANC 0.5) Renal: AKI resolved, SCr wnl; CrCl 62.8 CG PCT elevated, LA elevated, trending down CXR: LUL/RUL infiltrates cannot r/o infection  Goal of Therapy:  Vancomycin trough level 15-20 mcg/ml  Eradication of infection Appropriate antibiotic dosing for indication and renal function  Plan:  Day 2 antibiotics Increase vancomycin to 750 mg IV q12 hr with improved renal function.  PNA may be only d/t S pneumo, but will allow another 24 hr for Cx to result. Measure vancomycin trough levels at steady state as indicated Continue Zosyn every 8  hrs by 4-hr infusion  Follow clinical course, renal function, culture results as available  Follow for de-escalation of antibiotics and LOT   Reuel Boom, PharmD, BCPS Pager: [phone] 02/28/2015, 12:08 PM

## 2015-02-28 NOTE — Progress Notes (Signed)
Fentanyl Drip dc'd , 180 cc (10 mcg/ml) wasted

## 2015-02-28 NOTE — Progress Notes (Signed)
[facility] ADULT ICU REPLACEMENT PROTOCOL FOR AM LAB REPLACEMENT ONLY  The patient does apply for the [facility] Adult ICU Electrolyte Replacment Protocol based on the criteria listed below:   1. Is GFR >/= 40 ml/min? Yes.    Patient's GFR today is >60 2. Is urine output >/= 0.5 ml/kg/hr for the last 6 hours? Yes.   Patient's UOP is 1.4 ml/kg/hr 3. Is BUN < 60 mg/dL? Yes.    Patient's BUN today is 17 4. Abnormal electrolyte(s): K3.2,Mg1.4 5. Ordered repletion with: per protocol 6. If a panic level lab has been reported, has the CCM MD in charge been notified? Yes.  .   Physician:  Elder Cyphers, MD  Vear Clock 02/28/2015 5:56 AM

## 2015-03-01 ENCOUNTER — Inpatient Hospital Stay (HOSPITAL_COMMUNITY): Payer: Medicaid Other

## 2015-03-01 LAB — CBC WITH DIFFERENTIAL/PLATELET
BASOS ABS: 0 10*3/uL (ref 0.0–0.1)
Basophils Relative: 0 %
EOS ABS: 0 10*3/uL (ref 0.0–0.7)
Eosinophils Relative: 0 %
HEMATOCRIT: 25.4 % — AB (ref 36.0–46.0)
HEMOGLOBIN: 8.1 g/dL — AB (ref 12.0–15.0)
LYMPHS PCT: 9 %
Lymphs Abs: 0.2 10*3/uL — ABNORMAL LOW (ref 0.7–4.0)
MCH: 26.6 pg (ref 26.0–34.0)
MCHC: 31.9 g/dL (ref 30.0–36.0)
MCV: 83.6 fL (ref 78.0–100.0)
MONOS PCT: 4 %
Monocytes Absolute: 0.1 10*3/uL (ref 0.1–1.0)
Neutro Abs: 1.8 10*3/uL (ref 1.7–7.7)
Neutrophils Relative %: 87 %
Platelets: 29 10*3/uL — CL (ref 150–400)
RBC: 3.04 MIL/uL — AB (ref 3.87–5.11)
RDW: 15.3 % (ref 11.5–15.5)
WBC: 2.1 10*3/uL — AB (ref 4.0–10.5)

## 2015-03-01 LAB — BASIC METABOLIC PANEL
ANION GAP: 8 (ref 5–15)
Anion gap: 8 (ref 5–15)
BUN: 15 mg/dL (ref 6–20)
BUN: 12 mg/dL (ref 6–20)
CALCIUM: 8.2 mg/dL — AB (ref 8.9–10.3)
CHLORIDE: 106 mmol/L (ref 101–111)
CO2: 22 mmol/L (ref 22–32)
CO2: 25 mmol/L (ref 22–32)
Calcium: 7.7 mg/dL — ABNORMAL LOW (ref 8.9–10.3)
Chloride: 106 mmol/L (ref 101–111)
Creatinine, Ser: 0.75 mg/dL (ref 0.44–1.00)
Creatinine, Ser: 0.69 mg/dL (ref 0.44–1.00)
GFR calc non Af Amer: >60 mL/min (ref >60)
GLUCOSE: 77 mg/dL (ref 65–99)
Glucose, Bld: 86 mg/dL (ref 65–99)
POTASSIUM: 2.9 mmol/L — AB (ref 3.5–5.1)
Potassium: 3 mmol/L — ABNORMAL LOW (ref 3.5–5.1)
SODIUM: 136 mmol/L (ref 135–145)
Sodium: 139 mmol/L (ref 135–145)

## 2015-03-01 LAB — MAGNESIUM: MAGNESIUM: 2.7 mg/dL — AB (ref 1.7–2.4)

## 2015-03-01 MED ORDER — FUROSEMIDE 10 MG/ML IJ SOLN
40.0000 mg | Freq: Two times a day (BID) | INTRAMUSCULAR | Status: AC
  Administered 2015-03-01 – 2015-03-02 (×2): 40 mg via INTRAVENOUS
  Filled 2015-03-01 (×2): qty 4

## 2015-03-01 MED ORDER — MAGIC MOUTHWASH W/LIDOCAINE
5.0000 mL | Freq: Three times a day (TID) | ORAL | Status: DC
  Administered 2015-03-01 – 2015-03-02 (×4): 5 mL via ORAL
  Filled 2015-03-01 (×28): qty 5

## 2015-03-01 MED ORDER — POTASSIUM CHLORIDE CRYS ER 20 MEQ PO TBCR
40.0000 meq | EXTENDED_RELEASE_TABLET | Freq: Two times a day (BID) | ORAL | Status: AC
  Administered 2015-03-01 (×2): 40 meq via ORAL
  Filled 2015-03-01 (×2): qty 2

## 2015-03-01 MED ORDER — POTASSIUM CHLORIDE 10 MEQ/50ML IV SOLN
10.0000 meq | INTRAVENOUS | Status: AC
  Administered 2015-03-01 (×8): 10 meq via INTRAVENOUS
  Filled 2015-03-01 (×8): qty 50

## 2015-03-01 NOTE — Progress Notes (Signed)
PULMONARY / CRITICAL CARE MEDICINE   Name: Joan Hoover MRN: [id number] DOB: 1960/06/28    ADMISSION DATE:  02/27/2015   REFERRING MD :  EDP  CHIEF COMPLAINT:  Abd pain  INITIAL PRESENTATION:  54 y/o female with lymphoma receiving chemotherapy was admitted on 9/21 with septic shock in setting of neutropenia.    STUDIES:  9/20 CT abdomen/pelvis >> large left and moderate R pleural effusion with adjacent airspace opacities, third spacing of fluid in mesentery, retroperitoneal and left pelvic adenopathy consistent with lymphoma, new endplate compression fractures, mild bowel wall thickening 9/21 ECHO >> EF 55-60%, grade 1 DD, left pleural effusion  SIGNIFICANT EVENTS: 9/20  Admitted, SvO2 ~60%, started on dobutamine   SUBJECTIVE:  Remains on dobutamine, CXR with bilateral effusions.  Pt reports feeling better.  Productive cough.    VITAL SIGNS: Temp:  [97 F (36.1 C)-97.8 F (36.6 C)] 97.5 F (36.4 C) (09/22 0727) Pulse Rate:  [76-110] 97 (09/22 0727) Resp:  [12-24] 15 (09/22 0727) SpO2:  [87 %-100 %] 97 % (09/22 1016) Arterial Line BP: (121-149)/(66-80) 147/79 mmHg (09/22 0727) Weight:  [145 lb 15.1 oz (66.2 kg)] 145 lb 15.1 oz (66.2 kg) (09/22 0400)   HEMODYNAMICS: CVP:  [7 mmHg-13 mmHg] 8 mmHg   VENTILATOR SETTINGS:     INTAKE / OUTPUT:  Intake/Output Summary (Last 24 hours) at 03/01/15 1056 Last data filed at 03/01/15 0700  Gross per 24 hour  Intake 599.42 ml  Output   1050 ml  Net -450.58 ml    PHYSICAL EXAMINATION: Gen: chronically ill appearing female in NAD HENT: NCAT EOMi, MM pink/moist, no jvd PULM: Wheezing bilaterally, diminished left base CV: RRR, no mgr GI: BS+, soft, nontender MSK: normal bulk and tone, SCD in place Neuro: AAOx4, speech clear, MAE  LABS:  CBC  Recent Labs Lab 02/27/15 0545 02/28/15 0405 03/01/15 0515  WBC 0.9* 0.8* 2.1*  HGB 11.5* 9.4* 8.1*  HCT 36.3 28.9* 25.4*  PLT 112* 44* 29*   Coag's  Recent Labs Lab  02/27/15 1102  APTT 28  INR 1.36   BMET  Recent Labs Lab 02/27/15 2110 02/28/15 0405 03/01/15 0515  NA 135 135 136  K 2.6* 3.2* 2.9*  CL 107 108 106  CO2 16* 17* 22  BUN 21* 20 15  CREATININE 0.99 0.99 0.75  GLUCOSE 116* 109* 86   Electrolytes  Recent Labs Lab 02/27/15 1102 02/27/15 2110 02/28/15 0405 02/28/15 1925 03/01/15 0515  CALCIUM  --  6.6* 6.7*  --  7.7*  MG 1.2*  --  1.4* 2.8* 2.7*  PHOS 3.7  --  3.0  --   --    Sepsis Markers  Recent Labs Lab 02/27/15 1102 02/27/15 1103 02/27/15 1422 02/28/15 0405  LATICACIDVEN  --  2.9* 3.7* 2.3*  PROCALCITON 35.35  --   --   --    ABG  Recent Labs Lab 02/27/15 0930 02/28/15 0358 02/28/15 1045  PHART 7.388 7.497* 7.389  PCO2ART 28.4* 22.0* 30.7*  PO2ART 147* 144* 94.1   Liver Enzymes  Recent Labs Lab 02/27/15 0545  AST 47*  ALT 21  ALKPHOS 66  BILITOT 1.0  ALBUMIN 2.7*   Cardiac Enzymes  Recent Labs Lab 02/27/15 0545  TROPONINI 0.04*   Glucose  Recent Labs Lab 02/27/15 1238 02/27/15 1735 02/27/15 2125 02/28/15 0653 02/28/15 0812 02/28/15 1236  GLUCAP 138* 138* 107* 102* 117* 105*    Imaging 9/22 CXR > mod to large bilateral pleural effusions  ASSESSMENT / PLAN:  PULMONARY OETT 9/20>> A: HCAP Pleural effusion > uncertain etiology Acute respiratory failure with hypoxemia> due to HCAP Respiratory alkalosis from over ventilation Smoker > wheezing on exam today P:   Oxygen as needed to keep sats > 90% Pulmonary hygiene Bronchodilators Attempt diuresis 9/22, hold thoracentesis for now with thrombocytopenia.   Re-eval for thora need 9/23 Intermittent CXR  CARDIOVASCULAR CVL left IJ 9/21 >  R IJ porta cath 69 weeks old A:  Septic shock complicated by Adrenal insufficiency Also with ?cardiogenic shock as Coox low, could be related to sepsis Currently volume replete if not volume up P:  Levophed for MAP > 65 D/C dobutamine  ECHO reviewed, see above Monitor  CVP  RENAL A:  AKI> improving Hypokalemia P:   Monitor BMET and UOP Replace electrolytes as needed Lasix 40 mg BID x2 doses 9/22 with KCL   GASTROINTESTINAL A:   Abd pain > family reports this is chronic, CT abdomen with ? Mild colitis but no acute abnormalities Ascites noted, hypoalbuminemia? Portal vein thrombus? 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  Patient updated on plan of care.   - Inter-disciplinary family meet or Palliative Care meeting due by:  day [place], NP-C [facility] Pgr: [phone] or if no answer [phone] 03/01/2015, 10:56 AM

## 2015-03-01 NOTE — Progress Notes (Signed)
[place] Progress Note Patient Name: Joan Hoover DOB: December 27, 1960 MRN: [id number]   Date of Service  03/01/2015  HPI/Events of Note  Patient c/o sore throat. Recent chemotherapy for lymphoma. Mucositis vs candidiasis.   eICU Interventions  Will order Magic mouthwash with lidocaine TID.     Intervention Category Intermediate Interventions: Pain - evaluation and management  Raeden Belzer Cornelia Copa 03/01/2015, 5:55 PM

## 2015-03-01 NOTE — Progress Notes (Signed)
Nutrition Follow-up  INTERVENTION:   Encourage PO intake RD to continue to monitor  NUTRITION DIAGNOSIS:   Increased nutrient needs related to cancer and cancer related treatments as evidenced by estimated needs.  GOAL:   Patient will meet greater than or equal to 90% of their needs  Not meeting.  MONITOR:   PO intake, Supplement acceptance, Labs, Weight trends, Skin, I & O's  ASSESSMENT:   54 yo female , life long smoker, reported to have been diagnosed with lymphoma 2 weeks ago but has a portacath In place and received chemo 2 weeks ago in [place]  per Dr. Regis Bill. She presents to [facility] ED with complaints of nausea, abd pain and is found to be profoundly hypoxic despite 100% FIO2 and attempted NIMVS. Her PCO2 is 19, PH 7.48. PO2 146 on 100%. Lactic acid is >9 and her metabolic acidosis will most likely overwhelm her respiratory compensation. She is on levophed for hypotension(stress steroids ordered stat) and fluid resuscitation has been instituted. She is a poor historian and her EMR are not available in EPIC. Plan is to secure an airway, treat suspected infection with broad spectrum abx and check abd radiographs.   Pt extubated 9/21. Pt currently order full liquid diet. Pt continues to have soreness in her throat. SLP evaluated and recommended full liquids. Pt states that she does not like Ensure/Boost drinks, encouraged her to try chocolate milk with meals.   Labs reviewed: Low K  Diet Order:  Diet full liquid Room service appropriate?: Yes; Fluid consistency:: Thin  Skin:     Last BM:  PTA  Height:   Ht Readings from Last 1 Encounters:  02/27/15 5\' 4"  (1.626 m)    Weight:   Wt Readings from Last 1 Encounters:  03/01/15 145 lb 15.1 oz (66.2 kg)    Ideal Body Weight:  54.54 kg (kg)  BMI:  Body mass index is 25.04 kg/(m^2).  Estimated Nutritional Needs:   Kcal:  1700-1900  Protein:  85-95g  Fluid:  1.7L/day  EDUCATION NEEDS:   No education needs  identified at this time  Clayton Bibles, MS, RD, LDN Pager: [phone] After Hours Pager: [phone]

## 2015-03-01 NOTE — Progress Notes (Signed)
PT Cancellation Note  Patient Details Name: Joan Hoover MRN: [id number] DOB: [birth year]   Cancelled Treatment:    Reason Eval/Treat Not Completed: Fatigue/lethargy limiting ability to participate Pt just had a-line and femoral line removed, also with low platelets.  RN reports to hold therapy today and pt tired as well.  Will check back as schedule permits.   Endya Austin,KATHrine E 03/01/2015, 2:00 PM Carmelia Bake, PT, DPT 03/01/2015 Pager: [phone]

## 2015-03-01 NOTE — Progress Notes (Signed)
[facility] ADULT ICU REPLACEMENT PROTOCOL FOR AM LAB REPLACEMENT ONLY  The patient does apply for the [facility] Adult ICU Electrolyte Replacment Protocol based on the criteria listed below:   1. Is GFR >/= 40 ml/min? Yes.    Patient's GFR today is >60 2. Is urine output >/= 0.5 ml/kg/hr for the last 6 hours? Yes.   Patient's UOP is 0.62 ml/kg/hr 3. Is BUN < 60 mg/dL? Yes.    Patient's BUN today is 15 4. Abnormal electrolyte(s): Potassium, 2.9 5. Ordered repletion with: [facility] adult ICU replacement protocol 6. If a panic level lab has been reported, has the CCM MD in charge been notified? Yes.  .   Physician:  Dr. Merton Border  [facility], Darrick Huntsman E 03/01/2015 6:40 AM

## 2015-03-01 NOTE — Evaluation (Signed)
Clinical/Bedside Swallow Evaluation Patient Details  Name: Joan Hoover MRN: [id number] Date of Birth: 1960/12/10  Today's Date: 03/01/2015 Time: SLP Start Time (ACUTE ONLY): 1049 SLP Stop Time (ACUTE ONLY): 1101 SLP Time Calculation (min) (ACUTE ONLY): 12 min  Past Medical History:  Past Medical History  Diagnosis Date  . Cancer     lymphoma    Past Surgical History:  Past Surgical History  Procedure Laterality Date  . Porta cath placement     HPI:  54 y/o female with lymphoma receiving chemotherapy was admitted on 9/21 with septic shock in setting of neutropenia.  Intubated 9/20-9/21. Pleural effusion > uncertain etiology; Acute respiratory failure with hypoxemia> due to HCAP; Respiratory alkalosis from over ventilation.   Assessment / Plan / Recommendation Clinical Impression  Pt appears to be protecting airway during swallow with RR within compatible range for swallowing sequence, noted exhalations post all swallows, normal mastication and brisk swallow response.  Pt with intermittent baseline cough during assessment - does not appear to be related to POs.  However, assessment was limited - pt having difficulty breathing per her report; declined several foods due to lesions in mouth - she is generally uncomfortable.  Recommend full liquid diet for now to enhance her comfort; reminded pt to eat/drink cautiously given her c/o breathing difficulty.  SLP will f/u next date for PO tolerance.  D/W RN.     Aspiration Risk  Mild    Diet Recommendation  (full liquid diet)   Medication Administration: Whole meds with puree Compensations: Slow rate;Small sips/bites    Other  Recommendations Oral Care Recommendations: Oral care BID   Follow Up Recommendations       Frequency and Duration min 2x/week  2 weeks   Swallow Study Prior Functional Status       General Date of Onset: 02/27/15 Other Pertinent Information: 54 y/o female with lymphoma receiving chemotherapy was admitted  on 9/21 with septic shock in setting of neutropenia.  Intubated 9/20-9/21. Pleural effusion > uncertain etiology; Acute respiratory failure with hypoxemia> due to HCAP; Respiratory alkalosis from over ventilation. Type of Study: Bedside swallow evaluation Previous Swallow Assessment: none Diet Prior to this Study: NPO Temperature Spikes Noted: No Respiratory Status: Supplemental O2 delivered via (comment) History of Recent Intubation: Yes Length of Intubations (days): 1 days Date extubated: 02/28/15 Behavior/Cognition: Alert Oral Cavity - Dentition: Missing dentition Self-Feeding Abilities: Needs assist;Able to feed self Patient Positioning: Upright in bed Baseline Vocal Quality: Hoarse Volitional Cough: Strong Volitional Swallow: Able to elicit    Oral/Motor/Sensory Function Overall Oral Motor/Sensory Function: Appears within functional limits for tasks assessed (lesions in mouth causing pain)   Ice Chips Ice chips: Within functional limits   Thin Liquid Thin Liquid: Within functional limits Presentation: Straw    Nectar Thick Nectar Thick Liquid: Not tested   Honey Thick Honey Thick Liquid: Not tested   Puree Puree: Within functional limits   Solid  Joan L. Couture, MA CCC/SLP Pager [phone]     Solid:  (pt declned due to sores in mouth)       Joan Hoover Joan Hoover 03/01/2015,11:10 AM

## 2015-03-01 NOTE — Progress Notes (Signed)
CRITICAL VALUE ALERT  Critical value received:  Platelets 29  Date of notification:  03/01/15  Time of notification:  0600  Critical value read back:Yes.    Nurse who received alert:  Leola Brazil, RN  MD notified (1st page):  Dr. Alva Garnet  Time of first page:  0600 called [facility]  MD notified (2nd page):  Time of second page:  Responding MD:  Dr. Alva Garnet  Time MD responded:  0600

## 2015-03-02 ENCOUNTER — Inpatient Hospital Stay (HOSPITAL_COMMUNITY): Payer: Medicaid Other

## 2015-03-02 DIAGNOSIS — L8990 Pressure ulcer of unspecified site, unspecified stage: Secondary | ICD-10-CM

## 2015-03-02 LAB — CBC
HCT: 27.8 % — ABNORMAL LOW (ref 36.0–46.0)
Hemoglobin: 8.8 g/dL — ABNORMAL LOW (ref 12.0–15.0)
MCH: 27.1 pg (ref 26.0–34.0)
MCHC: 31.7 g/dL (ref 30.0–36.0)
MCV: 85.5 fL (ref 78.0–100.0)
PLATELETS: 51 10*3/uL — AB (ref 150–400)
RBC: 3.25 MIL/uL — ABNORMAL LOW (ref 3.87–5.11)
RDW: 15.2 % (ref 11.5–15.5)
WBC: 5.2 10*3/uL (ref 4.0–10.5)

## 2015-03-02 LAB — BASIC METABOLIC PANEL
ANION GAP: 10 (ref 5–15)
Anion gap: 12 (ref 5–15)
BUN: 14 mg/dL (ref 6–20)
BUN: 13 mg/dL (ref 6–20)
CALCIUM: 8.4 mg/dL — AB (ref 8.9–10.3)
CO2: 27 mmol/L (ref 22–32)
CO2: 31 mmol/L (ref 22–32)
CREATININE: 0.79 mg/dL (ref 0.44–1.00)
Calcium: 8.3 mg/dL — ABNORMAL LOW (ref 8.9–10.3)
Chloride: 102 mmol/L (ref 101–111)
Chloride: 98 mmol/L — ABNORMAL LOW (ref 101–111)
Creatinine, Ser: 0.95 mg/dL (ref 0.44–1.00)
GFR calc Af Amer: >60 mL/min (ref >60)
Glucose, Bld: 79 mg/dL (ref 65–99)
Glucose, Bld: 79 mg/dL (ref 65–99)
POTASSIUM: 2.7 mmol/L — AB (ref 3.5–5.1)
Potassium: 2.4 mmol/L — CL (ref 3.5–5.1)
SODIUM: 141 mmol/L (ref 135–145)
SODIUM: 139 mmol/L (ref 135–145)

## 2015-03-02 MED ORDER — POTASSIUM CHLORIDE 10 MEQ/50ML IV SOLN
10.0000 meq | INTRAVENOUS | Status: AC
  Administered 2015-03-02 – 2015-03-03 (×4): 10 meq via INTRAVENOUS
  Filled 2015-03-02 (×2): qty 50

## 2015-03-02 MED ORDER — POTASSIUM CHLORIDE CRYS ER 20 MEQ PO TBCR
40.0000 meq | EXTENDED_RELEASE_TABLET | Freq: Once | ORAL | Status: AC
  Administered 2015-03-02: 40 meq via ORAL
  Filled 2015-03-02: qty 2

## 2015-03-02 MED ORDER — FUROSEMIDE 10 MG/ML IJ SOLN
40.0000 mg | Freq: Once | INTRAMUSCULAR | Status: AC
  Administered 2015-03-02: 40 mg via INTRAVENOUS
  Filled 2015-03-02: qty 4

## 2015-03-02 MED ORDER — PHENOL 1.4 % MT LIQD
1.0000 | OROMUCOSAL | Status: DC | PRN
  Administered 2015-03-02: 1 via OROMUCOSAL
  Filled 2015-03-02: qty 177

## 2015-03-02 MED ORDER — POTASSIUM CHLORIDE 10 MEQ/50ML IV SOLN
10.0000 meq | INTRAVENOUS | Status: AC
  Administered 2015-03-02 (×4): 10 meq via INTRAVENOUS
  Filled 2015-03-02 (×3): qty 50

## 2015-03-02 NOTE — Progress Notes (Signed)
03/02/15  1330  Patient states she does not want to have foley removed. Pt became tearful stating she would wet herself at least five times a day at home because she was not able to get up to the  [place].

## 2015-03-02 NOTE — Progress Notes (Signed)
[place] Progress Note Patient Name: Anitha Kreiser DOB: 1961/04/29 MRN: [id number]   Date of Service  03/02/2015  HPI/Events of Note    eICU Interventions  Hypokalemia -repleted      Intervention Category Intermediate Interventions: Electrolyte abnormality - evaluation and management  ALVA,RAKESH V. 03/02/2015, 6:32 PM

## 2015-03-02 NOTE — Progress Notes (Signed)
PULMONARY / CRITICAL CARE MEDICINE   Name: Joan Hoover MRN: [id number] DOB: [birth year]    ADMISSION DATE:  02/27/2015   REFERRING MD :  EDP  CHIEF COMPLAINT:  Abd pain  INITIAL PRESENTATION:  54 y/o female with lymphoma receiving chemotherapy was admitted on 9/21 with septic shock in setting of neutropenia.    STUDIES:  9/20 CT abdomen/pelvis >> large left and moderate R pleural effusion with adjacent airspace opacities, third spacing of fluid in mesentery, retroperitoneal and left pelvic adenopathy consistent with lymphoma, new endplate compression fractures, mild bowel wall thickening 9/21 ECHO >> EF 55-60%, grade 1 DD, left pleural effusion  SIGNIFICANT EVENTS: 9/20  Admitted, SvO2 ~60%, started on dobutamine   SUBJECTIVE:  Better but platelets  are down remains frail.  VITAL SIGNS: Temp:  [97.7 F (36.5 C)-99 F (37.2 C)] 98.4 F (36.9 C) (09/23 0800) Pulse Rate:  [59-88] 59 (09/23 0800) Resp:  [11-25] 13 (09/23 0800) BP: (102-131)/(63-71) 127/68 mmHg (09/23 0800) SpO2:  [96 %-100 %] 99 % (09/23 0800) Arterial Line BP: (132-176)/(78-91) 145/91 mmHg (09/22 1300) Weight:  [140 lb 3.4 oz (63.6 kg)] 140 lb 3.4 oz (63.6 kg) (09/23 0500)   HEMODYNAMICS:     VENTILATOR SETTINGS:     INTAKE / OUTPUT:  Intake/Output Summary (Last 24 hours) at 03/02/15 1027 Last data filed at 03/02/15 0912  Gross per 24 hour  Intake  624.8 ml  Output   4100 ml  Net -3475.2 ml    PHYSICAL EXAMINATION: Gen: chronically ill appearing female in NAD HENT: NCAT EOMi, MM pink/moist, no jvd PULM: Wheezing bilaterally, diminished left base, + rhonchi CV: RRR, no mgr GI: BS+, soft, nontender MSK: normal bulk and tone, SCD in place Neuro: AAOx4, speech clear, MAE  LABS:  CBC  Recent Labs Lab 02/28/15 0405 03/01/15 0515 03/02/15 0920  WBC 0.8* 2.1* 5.2  HGB 9.4* 8.1* 8.8*  HCT 28.9* 25.4* 27.8*  PLT 44* 29* PENDING   Coag's  Recent Labs Lab 02/27/15 1102  APTT 28  INR  1.36   BMET  Recent Labs Lab 03/01/15 0515 03/01/15 2028 03/02/15 0530  NA 136 139 141  K 2.9* 3.0* 2.7*  CL 106 106 102  CO2 22 25 27   BUN 15 12 14   CREATININE 0.75 0.69 0.79  GLUCOSE 86 77 79   Electrolytes  Recent Labs Lab 02/27/15 1102  02/28/15 0405 02/28/15 1925 03/01/15 0515 03/01/15 2028 03/02/15 0530  CALCIUM  --   < > 6.7*  --  7.7* 8.2* 8.3*  MG 1.2*  --  1.4* 2.8* 2.7*  --   --   PHOS 3.7  --  3.0  --   --   --   --   < > = values in this interval not displayed. Sepsis Markers  Recent Labs Lab 02/27/15 1102 02/27/15 1103 02/27/15 1422 02/28/15 0405  LATICACIDVEN  --  2.9* 3.7* 2.3*  PROCALCITON 35.35  --   --   --    ABG  Recent Labs Lab 02/27/15 0930 02/28/15 0358 02/28/15 1045  PHART 7.388 7.497* 7.389  PCO2ART 28.4* 22.0* 30.7*  PO2ART 147* 144* 94.1   Liver Enzymes  Recent Labs Lab 02/27/15 0545  AST 47*  ALT 21  ALKPHOS 66  BILITOT 1.0  ALBUMIN 2.7*   Cardiac Enzymes  Recent Labs Lab 02/27/15 0545  TROPONINI 0.04*   Glucose  Recent Labs Lab 02/27/15 1238 02/27/15 1735 02/27/15 2125 02/28/15 0653 02/28/15 0812 02/28/15 1236  GLUCAP 138*  138* 107* 102* 117* 105*    Imaging 9/22 CXR > mod to large bilateral pleural effusions  ASSESSMENT / PLAN:  PULMONARY OETT 9/20>>9/21 A: HCAP Pleural effusion > uncertain etiology Acute respiratory failure with hypoxemia> due to HCAP Respiratory alkalosis from over ventilation Smoker > wheezing on exam today as always P:   Oxygen as needed to keep sats > 90% Pulmonary hygiene Bronchodilators  diuresis 9/22, hold thoracentesis for now with thrombocytopenia which is worse Re-eval for thora in future, will use diuresis for now Intermittent CXR  CARDIOVASCULAR CVL left IJ 9/21 >  R IJ porta cath 40 weeks old A:  Septic shock complicated by Adrenal insufficiency Also with ?cardiogenic shock as Coox low, could be related to sepsis Currently volume replete if not  volume up. Resolved P:   ECHO reviewed, see above Monitor CVP  RENAL  Intake/Output Summary (Last 24 hours) at 03/02/15 1034 Last data filed at 03/02/15 0912  Gross per 24 hour  Intake  624.8 ml  Output   4100 ml  Net -3475.2 ml    A:  AKI> improving Hypokalemia P:   Monitor BMET and UOP Replace electrolytes as needed Lasix 40 mg BID x2 doses 9/22 with KCL with negative i/o and resulting hypokalemia.  Repeat lasix and K+ 9/23  GASTROINTESTINAL A:   Abd pain > family reports this is chronic, CT abdomen with ? Mild colitis but no acute abnormalities Ascites noted, hypoalbuminemia? Portal vein thrombus? Hepatitis C > partial treatment with Sovaldi and ribavirin P:   RUQ ultrasound with doppler >>no acute findings PPI Antiemetic  Full liquid diet, advance as tolerated   HEMATOLOGIC A:   Diffuse large B-cell Lymphoma, on treatment with R-CHOP as of 02/19/15 (HIV neg 8/19) Neutropenia  Thrombocytopenia, plts pending for 9/23. P:  Monitor for bleeding SCD's for DVT prophylaxis  Check coags 9/23  INFECTIOUS A:   Septic shock, source HCAP but no clear abdominal source 9/23 most likely can dc vanco once all culture data in. 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  Patient updated on plan of care.   - Inter-disciplinary family meet or Palliative Care meeting due by:  day 7  Global: She is extubated and stable. We will tranfer to SDU and to [facility] as of 9/24.   Richardson Landry Minor ACNP Maryanna Shape PCCM Pager [phone] till 3 pm If no answer page [phone] 03/02/2015, 10:27 AM

## 2015-03-02 NOTE — Evaluation (Addendum)
Physical Therapy Evaluation Patient Details Name: Joan Hoover MRN: [id number] DOB: 1961/02/07 Today's Date: 03/02/2015   History of Present Illness  54 yo female , life long smoker, reported to have been diagnosed with lymphoma 2 weeks ago but has a portacath In place and received chemo 2 weeks ago in [place] per Dr. Regis Bill. She presents to [facility] ED with complaints of nausea, abd pain and is found to be profoundly hypoxic, dx of HCAP. Imaging showed fractures atall lumbar levels and at the T11 level. There also insufficiency fractures of both sacral ala as well as a vertical fracture of the right pubic body and adjacent pubic rami.   Clinical Impression  Pt admitted with above diagnosis. Pt currently with functional limitations due to the deficits listed below (see PT Problem List). Min assist for bed to recliner, tolerance limited by abdominal pain and fatigue. SaO2 87% on RA with minimal activity.  Pt will benefit from skilled PT to increase their independence and safety with mobility to allow discharge to the venue listed below.       Follow Up Recommendations May need SNF, depending on progress    Equipment Recommendations  Rolling walker with 5" wheels    Recommendations for Other Services       Precautions / Restrictions Precautions Precautions: Fall Precaution Comments: monitor O2 Restrictions Weight Bearing Restrictions: No      Mobility  Bed Mobility Overal bed mobility: Modified Independent             General bed mobility comments: used rail, HOB up 30*  Transfers Overall transfer level: Needs assistance Equipment used: None Transfers: Sit to/from [facility] Sit to Stand: Min assist Stand pivot transfers: Min assist       General transfer comment: min A to rise and steady during pivot, SaO2 87% on RA with activity, with 2L O2 applied came up to 93% in 1 minute  Ambulation/Gait                Stairs             Wheelchair Mobility    Modified Rankin (Stroke Patients Only)       Balance Overall balance assessment: Needs assistance Sitting-balance support: Feet supported Sitting balance-Leahy Scale: Good       Standing balance-Leahy Scale: Poor                               Pertinent Vitals/Pain Pain Assessment: 0-10 Pain Score: 6  Pain Location: abdomen Pain Descriptors / Indicators: Sharp Pain Intervention(s): Monitored during session;Premedicated before session    Home Living Family/patient expects to be discharged to:: Private residence Living Arrangements: Children (lives with daughter) Available Help at Discharge: Family;Available PRN/intermittently         Home Layout: One level Home Equipment: Cane - single point      Prior Function Level of Independence: Independent with assistive device(s)         Comments: uses SPC, denies falls in past year, Independent with bathing/dressing     Hand Dominance        Extremity/Trunk Assessment   Upper Extremity Assessment: Overall WFL for tasks assessed           Lower Extremity Assessment: Overall WFL for tasks assessed      Cervical / Trunk Assessment: Normal  Communication   Communication: No difficulties  Cognition Arousal/Alertness: Awake/alert Behavior During Therapy: WFL for tasks assessed/performed  Overall Cognitive Status: Within Functional Limits for tasks assessed                      General Comments      Exercises        Assessment/Plan    PT Assessment Patient needs continued PT services  PT Diagnosis Generalized weakness;Difficulty walking   PT Problem List Cardiopulmonary status limiting activity;Decreased activity tolerance;Decreased mobility;Decreased strength  PT Treatment Interventions DME instruction;Gait training;Functional mobility training;Therapeutic activities;Patient/family education;Therapeutic exercise   PT Goals (Current goals can be found in  the Care Plan section) Acute Rehab PT Goals Patient Stated Goal: take care of grandkids, clean the house PT Goal Formulation: With patient Time For Goal Achievement: 03/02/15 Potential to Achieve Goals: Fair    Frequency Min 3X/week   Barriers to discharge Decreased caregiver support      Co-evaluation               End of Session   Activity Tolerance: No increased pain;Patient limited by fatigue Patient left: in chair;with call bell/phone within reach Nurse Communication: Mobility status         Time: 1150-1207 PT Time Calculation (min) (ACUTE ONLY): 17 min   Charges:   PT Evaluation $Initial PT Evaluation Tier I: 1 Procedure     PT G Codes:        Philomena Doheny 03/02/2015, 12:50 PM [phone]

## 2015-03-02 NOTE — Progress Notes (Signed)
Speech Language Pathology Treatment:    Patient Details Name: Joan Hoover MRN: [id number] DOB: 18-Jun-1960 Today's Date: 03/02/2015 Time: 1235-1300 SLP Time Calculation (min) (ACUTE ONLY): 25 min  Assessment / Plan / Recommendation Clinical Impression  Skilled observation of current diet of full liquids with minimal verbal cues required for slow rate/small sips/bites consumed during meal; intermittent coughing throughout meal, but not in conjunction with immediate po intake; pt stated she "coughs when eating and not eating."  Breathing/eating/drinking balance discussed extensively with pt and she agrees with slower rate and smaller consumption helps improve respiratory status.  Pt consuming most of full liquid trays when eating without difficulty; larger pills are more difficult for her to swallow effectively, so puree consistency is recommended with medications prn.  Continue full liquids with probable upgrade as respiratory status improves.   HPI Other Pertinent Information: 54 y/o female with lymphoma receiving chemotherapy was admitted on 9/21 with septic shock in setting of neutropenia.  Intubated 9/20-9/21. Pleural effusion > uncertain etiology; Acute respiratory failure with hypoxemia> due to HCAP; Respiratory alkalosis from over ventilation.   Pertinent Vitals Pain Assessment: 0-10 Pain Score: 7  Pain Location:  (abdomen ) Pain Descriptors / Indicators: Sharp Pain Intervention(s): Monitored during session;Premedicated before session  SLP Plan  Continue with current plan of care    Recommendations Diet recommendations: Other(comment) (Full liquids ) Liquids provided via: Cup Medication Administration: Whole meds with puree Compensations: Slow rate;Small sips/bites              Oral Care Recommendations: Oral care BID Plan: Continue with current plan of care         ADAMS,PAT, M.S., CCC-SLP 03/02/2015, 1:37 PM

## 2015-03-02 NOTE — Progress Notes (Signed)
CRITICAL VALUE ALERT  Critical value received:  K+ 2.4  Date of notification:  03/03/15  Time of notification:  1820   Critical value read back: Yes  Nurse who received alert:  Rosaelena Kemnitz  MD notified (1st page):  Elsworth Soho  Time of first page:  1826  MD notified (2nd page):  Time of second page:  Responding MD:    Time MD responded:

## 2015-03-03 ENCOUNTER — Inpatient Hospital Stay (HOSPITAL_COMMUNITY): Payer: Medicaid Other

## 2015-03-03 DIAGNOSIS — F1190 Opioid use, unspecified, uncomplicated: Secondary | ICD-10-CM | POA: Diagnosis present

## 2015-03-03 DIAGNOSIS — D696 Thrombocytopenia, unspecified: Secondary | ICD-10-CM

## 2015-03-03 DIAGNOSIS — E876 Hypokalemia: Secondary | ICD-10-CM | POA: Diagnosis present

## 2015-03-03 DIAGNOSIS — J189 Pneumonia, unspecified organism: Secondary | ICD-10-CM | POA: Diagnosis present

## 2015-03-03 DIAGNOSIS — N179 Acute kidney failure, unspecified: Secondary | ICD-10-CM | POA: Diagnosis present

## 2015-03-03 DIAGNOSIS — C8330 Diffuse large B-cell lymphoma, unspecified site: Secondary | ICD-10-CM | POA: Diagnosis present

## 2015-03-03 DIAGNOSIS — F1120 Opioid dependence, uncomplicated: Secondary | ICD-10-CM | POA: Diagnosis present

## 2015-03-03 DIAGNOSIS — E2740 Unspecified adrenocortical insufficiency: Secondary | ICD-10-CM | POA: Diagnosis present

## 2015-03-03 DIAGNOSIS — J948 Other specified pleural conditions: Secondary | ICD-10-CM

## 2015-03-03 DIAGNOSIS — F1910 Other psychoactive substance abuse, uncomplicated: Secondary | ICD-10-CM | POA: Diagnosis present

## 2015-03-03 DIAGNOSIS — J90 Pleural effusion, not elsewhere classified: Secondary | ICD-10-CM | POA: Diagnosis present

## 2015-03-03 DIAGNOSIS — F1110 Opioid abuse, uncomplicated: Secondary | ICD-10-CM

## 2015-03-03 DIAGNOSIS — D638 Anemia in other chronic diseases classified elsewhere: Secondary | ICD-10-CM | POA: Diagnosis present

## 2015-03-03 LAB — CBC
HCT: 31.8 % — ABNORMAL LOW (ref 36.0–46.0)
HEMOGLOBIN: 9.9 g/dL — AB (ref 12.0–15.0)
MCH: 26.8 pg (ref 26.0–34.0)
MCHC: 31.1 g/dL (ref 30.0–36.0)
MCV: 85.9 fL (ref 78.0–100.0)
Platelets: 84 10*3/uL — ABNORMAL LOW (ref 150–400)
RBC: 3.7 MIL/uL — ABNORMAL LOW (ref 3.87–5.11)
RDW: 15.3 % (ref 11.5–15.5)
WBC: 4.7 10*3/uL (ref 4.0–10.5)

## 2015-03-03 LAB — CULTURE, RESPIRATORY W GRAM STAIN

## 2015-03-03 LAB — CULTURE, RESPIRATORY

## 2015-03-03 LAB — BASIC METABOLIC PANEL
Anion gap: 9 (ref 5–15)
BUN: 12 mg/dL (ref 6–20)
CALCIUM: 8.2 mg/dL — AB (ref 8.9–10.3)
CHLORIDE: 97 mmol/L — AB (ref 101–111)
CO2: 33 mmol/L — AB (ref 22–32)
CREATININE: 1.05 mg/dL — AB (ref 0.44–1.00)
GFR calc non Af Amer: 60 mL/min — ABNORMAL LOW (ref >60)
Glucose, Bld: 63 mg/dL — ABNORMAL LOW (ref 65–99)
Potassium: 3 mmol/L — ABNORMAL LOW (ref 3.5–5.1)
SODIUM: 139 mmol/L (ref 135–145)

## 2015-03-03 LAB — PHOSPHORUS: Phosphorus: 2.1 mg/dL — ABNORMAL LOW (ref 2.5–4.6)

## 2015-03-03 LAB — MAGNESIUM: MAGNESIUM: 1.6 mg/dL — AB (ref 1.7–2.4)

## 2015-03-03 LAB — PROTIME-INR
INR: 1.25 (ref 0.00–1.49)
Prothrombin Time: 15.8 seconds — ABNORMAL HIGH (ref 11.6–15.2)

## 2015-03-03 LAB — APTT: APTT: 28 s (ref 24–37)

## 2015-03-03 MED ORDER — SODIUM CHLORIDE 0.9 % IJ SOLN
10.0000 mL | INTRAMUSCULAR | Status: DC | PRN
  Administered 2015-03-06 – 2015-03-09 (×6): 10 mL
  Filled 2015-03-03 (×6): qty 40

## 2015-03-03 MED ORDER — POTASSIUM CHLORIDE 10 MEQ/50ML IV SOLN
10.0000 meq | INTRAVENOUS | Status: AC
  Administered 2015-03-03 (×4): 10 meq via INTRAVENOUS
  Filled 2015-03-03 (×2): qty 50

## 2015-03-03 MED ORDER — MAGNESIUM SULFATE 2 GM/50ML IV SOLN
2.0000 g | Freq: Once | INTRAVENOUS | Status: AC
  Administered 2015-03-03: 2 g via INTRAVENOUS
  Filled 2015-03-03: qty 50

## 2015-03-03 NOTE — Progress Notes (Signed)
Joan Hoover TRANSFERRED TO 1433-IN BED WITH O2 ON 2L [name]. WENT WITH 2 NT'S. REPORT CALLED TO SUSAN RN. FOUND PATIENT'S DENTURES IN ROOM- SENT WITH HER TO 1433.

## 2015-03-03 NOTE — Progress Notes (Signed)
Patient ID: Joan Hoover, female   DOB: 03-03-1961, 54 y.o.   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 Regis Bill). Of note, other than this admission, no other EMR available in [facility].  Patient presented to [facility] ED 02/27/2015 with nausea, vomiting and abdominal pain. She was found to be profoundly hypoxic despite being on 100% FiO2. She was hypotensive as well. Imaging studies on admission included CT abdomen which demonstrated large left and moderate right pleural effusion with adjacent airspace opacities, considerable third spacing of fluid in the mesentery, retroperitoneal and left pelvic adenopathy (likely from patient's lymphoma). There were new endplate compression fractures and possible low grade colitis. CXR demonstrated ET tube, progressive left side pleural effusion and right upper lobe infiltrate.  Patient has required intubation on admission in addition to Levophed and stress dose steroids. She was started on vanco and zosyn for treatment of septic shock thought to be from healthcare associated pneumonia.   She was extubated 02/28/2015 and care was transitioned to [facility] starting 03/03/2015.   Assessment/Plan:    Principal Problem: Ventilatory dependent respiratory failure / Acute respiratory failure with hypoxia / healthcare associated pneumonia secondary to streptococcus pneumonia / left side pleural effusion  - Patient required endotracheal intubation from 9/20 through 9/21. Hypoxia thought to be related to combination of HCAP and pleural effusion. Patient's CXR on admission demonstrated progressive left side pleural effusion and right upper lobe infiltrate. - Patient was started on vanco and  zosyn on admission. Strep pneumonia antigen was positive. Legionella urinary antigen was negative. - Patient was under PCCM care through 9/23 and [facility] assumed care starting 03/03/2015. - Respiratory status is stable and patient is currently on 2 L [place] oxygen support. - Most recent CXR 9/24 demonstrated stable bilateral effusions and bilateral airspace disease which could represent edema, infection or atelectasis. - Thoracentesis not yet done because of ongoing thrombocytopenia. Meanwhile he has received lasix for diuresis  - Continue duoneb every 6 hours scheduled  - We will transfer the patient to telemetry floor today.  Active Problems: Septic shock complicated by adrenal insufficiency  - Pt found to be in septic shock on admission. This was thought to be secondary to HCAP   - Pt required levophed and stress dose steroids. All stopped at this point - Pt started on vanco and zosyn. We stopped vanco today since zosyn will cover for strep pneumonia - Blood cultures to date are negative, legionella is negative  - Pt has right IJ porta cath placed 3 weeks ago - 2 D ECHO 9/21 showed normal EF with grade 1 diastolic dysfunction  Acute renal failure - Perhaps related to sepsis verus vanco or lasix (given Lasix 40 mg BID x2 doses 9/22, it was then repeated 9/23) - Vanco stopped today - We will continue to monitor renal function   Hypokalemia / hypomagnesemia  - Hypokalemia likely due to lasix pt received 9/22 and 9/23 - Supplemented potassium and magnesium   Abdominal pain  - CT abdomen with mild colitis but no acute abnormalities - History of hepatitis C > partial treatment with Sovaldi and ribavirin - RUQ ultrasound with doppler demonstrated no acute findings  Diffuse large B-cell Lymphoma - On treatment with [facility] as of 02/19/15 (HIV neg  8/19)  Neutropenia / Thrombocytopenia /  Anemia of chronic disease  - Likely from lymphoma  - Counts improving  - Using SCD's bilaterally     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  Family Communication:  plan of care discussed with the patient Disposition Plan: transfer to telemetry floor today   IV access:  Peripheral IV  Procedures and diagnostic studies:    Dg Chest Port 1 View 03/09/15    Stable bilateral effusions and bilateral airspace disease which could represent edema, infection or atelectasis.     [place] Art/ven Flow Abd Pelv Doppler 03/02/2015   There is no evidence of portal, hepatic or splenic thrombosis or occlusion. Small amount of sludge is noted and distended gallbladder, but no other significant abnormality seen in the right upper quadrant of the abdomen. Small amount of ascites is noted.   Dg Chest Port 1 View 03/02/2015  1. Interim removal of left IJ line. Right Port-A-Cath in stable position. 2. Persistent unchanged pleural effusions. Underlying basilar infiltrates cannot be excluded. Heart size stable.     US Abdomen Limited Ruq 03/02/2015  There is no evidence of portal, hepatic or splenic thrombosis or occlusion. Small amount of sludge is noted and distended gallbladder, but no other significant abnormality seen in the right upper quadrant of the abdomen. Small amount of ascites is noted.    Dg Chest Port 1 View 03/01/2015   1. Large left and potentially moderate or large right pleural effusion layering with obscuration of the hemidiaphragms. The degree of right hemidiaphragmatic obscuration is increased. There could be some underlying interstitial edema or interstitial accentuation, but no discrete cardiomegaly is identified. 2. Endotracheal and nasogastric tubes have been removed.     2 D ECHO 02/28/2015 EF 55-60%, grade 1 DD, left pleural effusion  Dg Chest Port 1 View 02/28/2015   1. Endotracheal tube tip 1.3 cm above the carina. Proximal repositioning of approximately 2  cm suggested. Bilateral IJ lines in stable position. 2. Progressive left-sided pleural effusion. Fluid fluid collection in the left upper chest cannot be excluded. Left upper lobe atelectasis and/or infiltrate could cannot be excluded. Persistent bibasilar atelectasis. Mild right upper lobe infiltrate.   Ct Abdomen Pelvis Wo Contrast 02/27/2015    1. Large left and moderate right pleural effusions with adjacent airspace opacities mostly due to passive atelectasis. 2. Considerable third spacing of fluid in the mesentery, retroperitoneum, and subcutaneous tissues. Small amount of pelvic ascites. 3. Low-density blood pool favoring anemia. 4. Retroperitoneal and left pelvic adenopathy is mild but may be a manifestation of the patient's reported lymphoma. There is evidence of a recent all right inguinal excisional biopsy with clips in place. 5. Compared to 2010 there are new endplate compression fractures at all lumbar levels and at the T11 level. There also insufficiency fractures of both sacral ala as well as a vertical fracture of the right pubic body and adjacent pubic rami, with adjacent heterotopic ossification in the right hip adductor musculature indicating likely late subacute chronicity. 6. There is some mild bowel wall thickening along a long segment of sigmoid colon. The possibility of low-grade colitis is not excluded.     US Renal 02/27/2015  1. Echogenic kidneys bilaterally suggests underlying medical renal disease. 2. No evidence of hydronephrosis. 3. Foley catheter in place. 4. Bilateral pleural effusions.    Medical  Consultants:  [facility] resumed care 03/03/15.  Other Consultants:  PT SLP Nutrition  IAnti-Infectives:   Vanco 02/27/2015 --> 03/03/2015 Zosyn 02/27/2015 -->    Leisa Lenz, MD  [facility] Pager [phone]  Time spent in minutes: 25 minutes  If 7PM-7AM, please contact night-coverage [email or web address] Password [facility] 03/03/2015, 12:44 PM   LOS: 4 days     HPI/Subjective: No acute overnight events. Patient reports no respiratory distress.  Objective: Filed Vitals:   03/03/15 0947 03/03/15 1000 03/03/15 1100 03/03/15 1200  BP:    125/78  Pulse: 82 73 86 70  Temp: 99 F (37.2 C) 99 F (37.2 C) 99 F (37.2 C) 99 F (37.2 C)  TempSrc:      Resp: 18 16 17 18   Height:      Weight:      SpO2: 94% 95% 94% 94%    Intake/Output Summary (Last 24 hours) at 03/03/15 1244 Last data filed at 03/03/15 1054  Gross per 24 hour  Intake    775 ml  Output   3200 ml  Net  -2425 ml    Exam:   General:  Pt is alert, not in acute distress  Cardiovascular: Regular rate and rhythm, S1/S2, no murmurs  Respiratory: wheezing in upper lung lobes, diminished breath sounds (+)  Abdomen: Soft, non tender, non distended, bowel sounds present  Extremities: No edema, pulses DP and PT palpable bilaterally  Neuro: Grossly nonfocal  Data Reviewed: Basic Metabolic Panel:  Recent Labs Lab 02/27/15 1102  02/28/15 0405 02/28/15 1925 03/01/15 0515 03/01/15 2028 03/02/15 0530 03/02/15 1750 03/03/15 0500  NA  --   < > 135  --  136 139 141 139 139  K  --   < > 3.2*  --  2.9* 3.0* 2.7* 2.4* 3.0*  CL  --   < > 108  --  106 106 102 98* 97*  CO2  --   < > 17*  --  22 25 27 31  33*  GLUCOSE  --   < > 109*  --  86 77 79 79 63*  BUN  --   < > 20  --  15 12 14 13 12   CREATININE  --   < > 0.99  --  0.75 0.69 0.79 0.95 1.05*  CALCIUM  --   < > 6.7*  --  7.7* 8.2* 8.3* 8.4* 8.2*  MG 1.2*  --  1.4* 2.8* 2.7*  --   --   --  1.6*  PHOS 3.7  --  3.0  --   --   --   --   --  2.1*  < > = values in this interval not displayed. Liver Function Tests:  Recent Labs Lab 02/27/15 0545  AST 47*  ALT 21  ALKPHOS 66  BILITOT 1.0  PROT 5.1*  ALBUMIN 2.7*    Recent Labs Lab 02/27/15 1102  LIPASE <10*  AMYLASE 31   No results for input(s): AMMONIA in the last 168 hours. CBC:  Recent Labs Lab 02/27/15 0545 02/28/15 0405 03/01/15 0515 03/02/15 0920  03/03/15 0500  WBC 0.9* 0.8* 2.1* 5.2 4.7  NEUTROABS 0.0* 0.5* 1.8  --   --   HGB 11.5* 9.4* 8.1* 8.8* 9.9*  HCT 36.3 28.9* 25.4* 27.8* 31.8*  MCV 84.2 81.6 83.6 85.5 85.9  PLT 112* 44* 29* 51* 84*   Cardiac Enzymes:  Recent Labs Lab 02/27/15 0545  TROPONINI 0.04*   BNP: Invalid input(s): POCBNP CBG:  Recent Labs Lab  02/27/15 1735 02/27/15 2125 02/28/15 0653 02/28/15 0812 02/28/15 1236  GLUCAP 138* 107* 102* 117* 105*    Culture, blood (routine x 2)     Status: None (Preliminary result)   Collection Time: 02/27/15  5:45 AM  Result Value Ref Range Status   Specimen Description BLOOD RIGHT HAND  Final   Special Requests BOTTLES DRAWN AEROBIC AND ANAEROBIC 5ML  Final   Culture   Final    NO GROWTH 3 DAYS   Report Status PENDING  Incomplete  Culture, blood (routine x 2)     Status: None (Preliminary result)   Collection Time: 02/27/15  6:30 AM  Result Value Ref Range Status   Specimen Description BLOOD LEFT HAND  Final   Special Requests BOTTLES DRAWN AEROBIC AND ANAEROBIC 5ML  Final   Culture   Final    NO GROWTH 3 DAYS   Report Status PENDING  Incomplete  Urine culture     Status: None   Collection Time: 02/27/15  9:41 AM  Result Value Ref Range Status   Specimen Description URINE, CATHETERIZED  Final   Special Requests NONE  Final   Culture   Final    NO GROWTH 1 DAY   Report Status 02/28/2015 FINAL  Final  MRSA PCR Screening     Status: None   Collection Time: 02/27/15  9:42 AM  Result Value Ref Range Status   MRSA by PCR NEGATIVE NEGATIVE Final  Culture, respiratory (tracheal aspirate)     Status: None   Collection Time: 02/28/15  6:13 AM  Result Value Ref Range Status   Specimen Description TRACHEAL ASPIRATE  Final   Special Requests Immunocompromise  Final   Gram Stain   Final    NO ORGANISMS SEEN   Culture   Final    FEW CANDIDA ALBICANS   Report Status 03/03/2015 FINAL  Final     Scheduled Meds: . ipratropium-albuterol  3 mL Nebulization Q6H   . magic mouthwash   5 mL Oral TID  . methadone  10 mg Per Tube 3 times per day  . pantoprazole   40 mg Intravenous QHS  . piperacillin-tazobactam   3.375 g Intravenous 3 times per day  . vancomycin  750 mg Intravenous Q12H

## 2015-03-03 NOTE — Progress Notes (Signed)
[place] Progress Note Patient Name: Joan Hoover DOB: 11-19-1960 MRN: [id number]   Date of Service  03/03/2015  HPI/Events of Note  K+ = 3.0 and Creatinine = 1.05.  eICU Interventions  Will replete K+.     Intervention Category Intermediate Interventions: Electrolyte abnormality - evaluation and management  Sommer,Steven Eugene 03/03/2015, 6:30 AM

## 2015-03-04 ENCOUNTER — Inpatient Hospital Stay (HOSPITAL_COMMUNITY): Payer: Medicaid Other

## 2015-03-04 DIAGNOSIS — L8992 Pressure ulcer of unspecified site, stage 2: Secondary | ICD-10-CM | POA: Diagnosis present

## 2015-03-04 LAB — CULTURE, BLOOD (ROUTINE X 2)
CULTURE: NO GROWTH
CULTURE: NO GROWTH

## 2015-03-04 LAB — BASIC METABOLIC PANEL
ANION GAP: 16 — AB (ref 5–15)
BUN: 11 mg/dL (ref 6–20)
CALCIUM: 8 mg/dL — AB (ref 8.9–10.3)
CHLORIDE: 91 mmol/L — AB (ref 101–111)
CO2: 28 mmol/L (ref 22–32)
CREATININE: 1.17 mg/dL — AB (ref 0.44–1.00)
GFR calc non Af Amer: 53 mL/min — ABNORMAL LOW (ref >60)
Glucose, Bld: 46 mg/dL — ABNORMAL LOW (ref 65–99)
Potassium: 2.6 mmol/L — CL (ref 3.5–5.1)
SODIUM: 135 mmol/L (ref 135–145)

## 2015-03-04 LAB — CBC
HCT: 33.6 % — ABNORMAL LOW (ref 36.0–46.0)
HEMOGLOBIN: 10.6 g/dL — AB (ref 12.0–15.0)
MCH: 26.6 pg (ref 26.0–34.0)
MCHC: 31.5 g/dL (ref 30.0–36.0)
MCV: 84.4 fL (ref 78.0–100.0)
Platelets: 149 10*3/uL — ABNORMAL LOW (ref 150–400)
RBC: 3.98 MIL/uL (ref 3.87–5.11)
RDW: 15.1 % (ref 11.5–15.5)
WBC: 9.7 10*3/uL (ref 4.0–10.5)

## 2015-03-04 MED ORDER — POTASSIUM CHLORIDE 10 MEQ/100ML IV SOLN
10.0000 meq | INTRAVENOUS | Status: AC
  Administered 2015-03-04 (×3): 10 meq via INTRAVENOUS
  Filled 2015-03-04: qty 100

## 2015-03-04 MED ORDER — IPRATROPIUM-ALBUTEROL 0.5-2.5 (3) MG/3ML IN SOLN: 3.0000 mL | RESPIRATORY_TRACT | Status: DC | PRN

## 2015-03-04 MED ORDER — GUAIFENESIN ER 600 MG PO TB12
600.0000 mg | ORAL_TABLET | Freq: Two times a day (BID) | ORAL | Status: DC
  Administered 2015-03-04 – 2015-03-08 (×9): 600 mg via ORAL
  Filled 2015-03-04 (×9): qty 1

## 2015-03-04 NOTE — Progress Notes (Signed)
Large bilateral pleural effusion on CXR, likely explaining patient's respiratory status. Thoracentesis will be done tomorrow morning. Order placed. Leisa Lenz [facility] [phone]

## 2015-03-04 NOTE — Progress Notes (Signed)
ANTIBIOTIC CONSULT NOTE - FOLLOW UP  Pharmacy Consult for Zosyn Indication: r/o sepsis from abdominal infection  No Known Allergies  Patient Measurements: Height: 5\' 4"  (162.6 cm) Weight: 134 lb 11.2 oz (61.1 kg) IBW/kg (Calculated) : 54.7  Vital Signs: Temp: 97.5 F (36.4 C) (09/25 0529) Temp Source: Axillary (09/25 0529) BP: 146/53 mmHg (09/25 0529) Pulse Rate: 73 (09/25 0529) Intake/Output from previous day: 09/24 0701 - 09/25 0700 In: 350 [IV Piggyback:350] Out: 3000 [Urine:3000] Intake/Output from this shift:    Labs:  Recent Labs  03/02/15 0920 03/02/15 1750 03/03/15 0500 03/04/15 0800  WBC 5.2  --  4.7 9.7  HGB 8.8*  --  9.9* 10.6*  PLT 51*  --  84* 149*  CREATININE  --  0.95 1.05* 1.17*   Estimated Creatinine Clearance: 48.6 mL/min (by C-G formula based on Cr of 1.17). No results for input(s): VANCOTROUGH, VANCOPEAK, VANCORANDOM, GENTTROUGH, GENTPEAK, GENTRANDOM, TOBRATROUGH, TOBRAPEAK, TOBRARND, AMIKACINPEAK, AMIKACINTROU, AMIKACIN in the last 72 hours.   Microbiology: Recent Results (from the past 720 hour(s))  Culture, blood (routine x 2)     Status: None (Preliminary result)   Collection Time: 02/27/15  5:45 AM  Result Value Ref Range Status   Specimen Description BLOOD RIGHT HAND  Final   Special Requests BOTTLES DRAWN AEROBIC AND ANAEROBIC 5ML  Final   Culture   Final    NO GROWTH 4 DAYS Performed at [facility]    Report Status PENDING  Incomplete  Culture, blood (routine x 2)     Status: None (Preliminary result)   Collection Time: 02/27/15  6:30 AM  Result Value Ref Range Status   Specimen Description BLOOD LEFT HAND  Final   Special Requests BOTTLES DRAWN AEROBIC AND ANAEROBIC 5ML  Final   Culture   Final    NO GROWTH 4 DAYS Performed at [facility]    Report Status PENDING  Incomplete  Urine culture     Status: None   Collection Time: 02/27/15  9:41 AM  Result Value Ref Range Status   Specimen Description URINE,  CATHETERIZED  Final   Special Requests NONE  Final   Culture   Final    NO GROWTH 1 DAY Performed at [facility]    Report Status 02/28/2015 FINAL  Final  MRSA PCR Screening     Status: None   Collection Time: 02/27/15  9:42 AM  Result Value Ref Range Status   MRSA by PCR NEGATIVE NEGATIVE Final    Comment:        The GeneXpert MRSA Assay (FDA approved for NASAL specimens only), is one component of a comprehensive MRSA colonization surveillance program. It is not intended to diagnose MRSA infection nor to guide or monitor treatment for MRSA infections.   Culture, respiratory (tracheal aspirate)     Status: None   Collection Time: 02/28/15  6:13 AM  Result Value Ref Range Status   Specimen Description TRACHEAL ASPIRATE  Final   Special Requests Immunocompromised  Final   Gram Stain   Final    FEW WBC PRESENT,BOTH PMN AND MONONUCLEAR NO SQUAMOUS EPITHELIAL CELLS SEEN NO ORGANISMS SEEN Performed at [facility]    Culture   Final    FEW CANDIDA ALBICANS Performed at [facility]    Report Status 03/03/2015 FINAL  Final   Medical History: Past Medical History  Diagnosis Date  . Cancer     lymphoma    Medications:  Anti-infectives    Start  Dose/Rate Route Frequency Ordered Stop   02/28/15 2200  vancomycin (VANCOCIN) IVPB 750 mg/150 ml premix  Status:  Discontinued     750 mg 150 mL/hr over 60 Minutes Intravenous Every 12 hours 02/28/15 1153 03/03/15 1353   02/28/15 0800  vancomycin (VANCOCIN) IVPB 1000 mg/200 mL premix  Status:  Discontinued     1,000 mg 200 mL/hr over 60 Minutes Intravenous Every 24 hours 02/27/15 1337 02/28/15 1153   02/27/15 1400  piperacillin-tazobactam (ZOSYN) IVPB 3.375 g     3.375 g 12.5 mL/hr over 240 Minutes Intravenous 3 times per day 02/27/15 1335     02/27/15 0630  vancomycin (VANCOCIN) IVPB 1000 mg/200 mL premix     1,000 mg 200 mL/hr over 60 Minutes Intravenous  Once 02/27/15 0615 02/27/15 0844    02/27/15 0630  piperacillin-tazobactam (ZOSYN) IVPB 3.375 g     3.375 g 12.5 mL/hr over 240 Minutes Intravenous  Once 02/27/15 0615 02/27/15 0708     Assessment: 54 y.o. female with recent Dx lymphoma, now s/p PAC placement and 1st dose chemo, presents w/ nausea, abd pain.  Found to be profoundly hypoxic; admitted for protection of airway and treatment of suspected abdominal infection.  Pharmacy to dose vancomycin/Zosyn. Note chronic L pleural effusion.  9/20 >> Zosyn >> 9/20 >> vancomycin >> 9/24  9/20 blood: ngtd 9/20 urine: ng-final 9/20 trach aspirate: few Candida-final S. pneumo UAg: POS Legionella UAg: IP  Today, 03/04/2015: transferred to floor Tmax 99.3 WBC wnl 9.7K Renal: AKI resolved, SCr bumped sl to 1.17 today  Goal of Therapy: Eradication of infection Appropriate antibiotic dosing for indication and renal function  Plan:   Day 5 Zosyn 3.375gm q8 - 4 hr infusion  ? Length of therapy of Zosyn?  Thank you,  Minda Ditto PharmD Pager [phone] 03/04/2015, 11:09 AM

## 2015-03-04 NOTE — Progress Notes (Addendum)
Patient ID: Joan Hoover, female   DOB: [birth year], 54 y.o.   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 Regis Bill). Of note, other than this admission, no other EMR available in EPIC.  Patient presented to [facility] ED 02/27/2015 with nausea, vomiting and abdominal pain. She was found to be profoundly hypoxic despite being on 100% FiO2. She was hypotensive as well. Imaging studies on admission included CT abdomen which demonstrated large left and moderate right pleural effusion with adjacent airspace opacities, considerable third spacing of fluid in the mesentery, retroperitoneal and left pelvic adenopathy (likely from patient's lymphoma). There were new endplate compression fractures and possible low grade colitis. CXR demonstrated ET tube, progressive left side pleural effusion and right upper lobe infiltrate. Patient was extubated 02/28/2015.  Patient has required intubation on admission in addition to Levophed and stress dose steroids. She was started on vanco and zosyn for treatment of septic shock thought to be from healthcare associated pneumonia. In regards to left pleural effusion, we could not do thoracentesis until today due to thrombocytopenia (we placed order for thoracentesis today 9/25).  [facility] assumed care starting 03/03/2015. Patient was transferred out of SDU to telemetry 03/03/2015.  Anticipated discharge: once respiratory issues more stable. Plan for US thoracentesis today.   Assessment/Plan:    Principal Problem: Ventilatory dependent respiratory failure / Acute respiratory failure with hypoxia / healthcare associated pneumonia secondary to streptococcus pneumonia / Bilateral pleural effusions -  Patient has required endotracheal intubation from 9/20 through 9/21. Hypoxia likely due to combination of HCAP and pleural effusion.  - Patient's CXR on admission demonstrated progressive left side pleural effusion and right upper lobe infiltrate. She was started on vanco and zosyn on admission.  - Strep pneumonia antigen was positive. Legionella urinary antigen was negative. We stopped vancomycin 03/03/2015 and patient is currently on zosyn only. - She reports being more short of breth this am but she is not in acute distress. Her O2 saturation is 95% on [place] oxygen support (2-3 L). - CXR 9/24 demonstrated stable bilateral effusions and bilateral airspace disease which could represent edema, infection or atelectasis. - We will repeat CXR today. - Thoracentesis not yet done due to thrombocytopenia. She has received lasix for diuresis (9/22 and 9/23).  - Plan for US thoracentesis today if possible by IR. Platelet count 149 today. - Continue duoneb every 6 hours scheduled. Add duoneb as needed every 2 hours as needed for shortness of breath or wheezing.    Active Problems: Septic shock complicated by adrenal insufficiency  - Patient presented with septic shock on admission thought to be secondary to HCAP   - She has required levophed for pressure support. She also required stress dose steroids.  - She was on vanco and zosyn since admission. Stopped vanco  9/24 since strep pneumonia antigen is positive so zosyn will cover for strep pneumonia.  - Blood cultures to date are negative, legionella is negative  - Pt has right IJ porta cath placed 3 weeks ago - 2 D ECHO 9/21 - normal EF with grade 1 diastolic dysfunction  Acute renal failure - Likely due to sepsis verus vanco or lasix (given Lasix 40 mg BID x2 doses 9/22, it was then repeated 9/23) - Vanco  stopped 03-30-2023. - Renal function relatively stable, 1.05, 1.17 in past 24 hours.   Hypokalemia / hypomagnesemia  - Hypokalemia likely due to lasix pt  received 9/22 and 9/23. - Supplemented potassium and magnesium.  Abdominal pain  - CT abdomen with mild colitis but no acute abnormalities. - History of hepatitis C with partial treatment with Sovaldi and ribavirin. - RUQ ultrasound with doppler showed no acute findings.  Diffuse large B-cell Lymphoma - On treatment with R-CHOP as of 02/19/15 (HIV neg 8/19).  Neutropenia / Thrombocytopenia /  Anemia of chronic disease  - Likely secondary to lymphoma. - WBC count WNL, platelets 149, hemoglobin 10.6. - Using SCD's bilaterally for DVT prophylaxis.   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  Family Communication:  plan of care discussed with the patient Disposition Plan: she will be ready for discharge once her respiratory status improves, hopefully in next few days (maybe by 9/27 or 9/28)  IV access:  Peripheral IV  Procedures and diagnostic studies:    Dg Chest Port 1 View 2015/03/30    Stable bilateral effusions and bilateral airspace disease which could represent edema, infection or atelectasis.     [place] Art/ven Flow Abd Pelv Doppler 03/02/2015   There is no evidence of portal, hepatic or splenic thrombosis or occlusion. Small amount of sludge is noted and distended gallbladder, but no other significant abnormality seen in the right upper quadrant of the abdomen. Small amount of ascites is noted.   Dg Chest Port 1 View 03/02/2015  1. Interim removal of left IJ line. Right Port-A-Cath in stable position. 2. Persistent unchanged pleural effusions. Underlying basilar infiltrates cannot be excluded. Heart size stable.     US Abdomen Limited Ruq 03/02/2015  There is no evidence of portal, hepatic or splenic thrombosis or occlusion. Small amount of sludge is noted and distended gallbladder,  but no other significant abnormality seen in the right upper quadrant of the abdomen. Small amount of ascites is noted.    Dg Chest Port 1 View 03/01/2015   1. Large left and potentially moderate or large right pleural effusion layering with obscuration of the hemidiaphragms. The degree of right hemidiaphragmatic obscuration is increased. There could be some underlying interstitial edema or interstitial accentuation, but no discrete cardiomegaly is identified. 2. Endotracheal and nasogastric tubes have been removed.     2 D ECHO 02/28/2015 EF 55-60%, grade 1 DD, left pleural effusion  Dg Chest Port 1 View 02/28/2015   1. Endotracheal tube tip 1.3 cm above the carina. Proximal repositioning of approximately 2 cm suggested. Bilateral IJ lines in stable position. 2. Progressive left-sided pleural effusion. Fluid fluid collection in the left upper chest cannot be excluded. Left upper lobe atelectasis and/or infiltrate could cannot be excluded. Persistent bibasilar atelectasis. Mild right upper lobe infiltrate.   Ct Abdomen Pelvis Wo Contrast 02/27/2015    1. Large left and moderate right pleural effusions with adjacent airspace opacities mostly due to passive atelectasis. 2. Considerable third spacing of fluid in the mesentery, retroperitoneum, and subcutaneous tissues. Small amount of pelvic ascites. 3. Low-density blood pool favoring anemia. 4. Retroperitoneal and left pelvic adenopathy is mild but may be a manifestation of the patient's reported lymphoma. There is evidence of a recent all right inguinal excisional biopsy with clips in  place. 5. Compared to 2010 there are new endplate compression fractures at all lumbar levels and at the T11 level. There also insufficiency fractures of both sacral ala as well as a vertical fracture of the right pubic body and adjacent pubic rami, with adjacent heterotopic ossification in the right hip adductor musculature indicating likely late subacute chronicity. 6. There is  some mild bowel wall thickening along a long segment of sigmoid colon. The possibility of low-grade colitis is not excluded.     US Renal 02/27/2015  1. Echogenic kidneys bilaterally suggests underlying medical renal disease. 2. No evidence of hydronephrosis. 3. Foley catheter in place. 4. Bilateral pleural effusions.    Medical Consultants:  [facility] assumed care 03/03/15.  Other Consultants:  Physical therapy  SLP Nutrition  IAnti-Infectives:   Vanco 02/27/2015 --> 03/03/2015 Zosyn 02/27/2015 -->    DEVINE, Dedra Skeens, MD  [facility] Pager [phone]  Time spent in minutes: 25 minutes  If 7PM-7AM, please contact night-coverage [email or web address] Password [facility] 03/04/2015, 2:34 PM   LOS: 5 days    HPI/Subjective: No acute overnight events. Patient reports being short of breath but is not in acute respiratory distress.   Objective: Filed Vitals:   03/03/15 2207 03/04/15 0529 03/04/15 0734 03/04/15 1406  BP: 133/71 146/53    Pulse: 76 73    Temp: 97.6 F (36.4 C) 97.5 F (36.4 C)    TempSrc: Oral Axillary    Resp: 18 20    Height:      Weight:  61.1 kg (134 lb 11.2 oz)    SpO2: 95% 95% 92% 95%    Intake/Output Summary (Last 24 hours) at 03/04/15 1434 Last data filed at 03/04/15 0500  Gross per 24 hour  Intake    100 ml  Output   2000 ml  Net  -1900 ml    Exam:   General:  Pt is alert, no distress  Cardiovascular: Rate controlled, (+) S1, S2  Respiratory: diminished breath sounds, wheezing in upper lung lobes  Abdomen: non tender, non distended, (+) BS  Extremities: No tenderness, palpable pulses   Neuro: Nonfocal  Data Reviewed: Basic Metabolic Panel:  Recent Labs Lab 02/27/15 1102  02/28/15 0405 02/28/15 1925 03/01/15 0515 03/01/15 2028 03/02/15 0530 03/02/15 1750 03/03/15 0500 03/04/15 0800  NA  --   < > 135  --  136 139 141 139 139 135  K  --   < > 3.2*  --  2.9* 3.0* 2.7* 2.4* 3.0* 2.6*  CL  --   < > 108  --  106 106 102 98* 97* 91*  CO2  --    < > 17*  --  22 25 27 31  33* 28  GLUCOSE  --   < > 109*  --  86 77 79 79 63* 46*  BUN  --   < > 20  --  15 12 14 13 12 11   CREATININE  --   < > 0.99  --  0.75 0.69 0.79 0.95 1.05* 1.17*  CALCIUM  --   < > 6.7*  --  7.7* 8.2* 8.3* 8.4* 8.2* 8.0*  MG 1.2*  --  1.4* 2.8* 2.7*  --   --   --  1.6*  --   PHOS 3.7  --  3.0  --   --   --   --   --  2.1*  --   < > = values in this interval not displayed. Liver Function Tests:  Recent Labs Lab  02/27/15 0545  AST 47*  ALT 21  ALKPHOS 66  BILITOT 1.0  PROT 5.1*  ALBUMIN 2.7*    Recent Labs Lab 02/27/15 1102  LIPASE <10*  AMYLASE 31   No results for input(s): AMMONIA in the last 168 hours. CBC:  Recent Labs Lab 02/27/15 0545 02/28/15 0405 03/01/15 0515 03/02/15 0920 03/03/15 0500 03/04/15 0800  WBC 0.9* 0.8* 2.1* 5.2 4.7 9.7  NEUTROABS 0.0* 0.5* 1.8  --   --   --   HGB 11.5* 9.4* 8.1* 8.8* 9.9* 10.6*  HCT 36.3 28.9* 25.4* 27.8* 31.8* 33.6*  MCV 84.2 81.6 83.6 85.5 85.9 84.4  PLT 112* 44* 29* 51* 84* 149*   Cardiac Enzymes:  Recent Labs Lab 02/27/15 0545  TROPONINI 0.04*   BNP: Invalid input(s): POCBNP CBG:  Recent Labs Lab 02/27/15 1735 02/27/15 2125 02/28/15 0653 02/28/15 0812 02/28/15 1236  GLUCAP 138* 107* 102* 117* 105*    Culture, blood (routine x 2)     Status: None (Preliminary result)   Collection Time: 02/27/15  5:45 AM  Result Value Ref Range Status   Specimen Description BLOOD RIGHT HAND  Final   Special Requests BOTTLES DRAWN AEROBIC AND ANAEROBIC 5ML  Final   Culture   Final    NO GROWTH 3 DAYS   Report Status PENDING  Incomplete  Culture, blood (routine x 2)     Status: None (Preliminary result)   Collection Time: 02/27/15  6:30 AM  Result Value Ref Range Status   Specimen Description BLOOD LEFT HAND  Final   Special Requests BOTTLES DRAWN AEROBIC AND ANAEROBIC 5ML  Final   Culture   Final    NO GROWTH 3 DAYS   Report Status PENDING  Incomplete  Urine culture     Status: None    Collection Time: 02/27/15  9:41 AM  Result Value Ref Range Status   Specimen Description URINE, CATHETERIZED  Final   Special Requests NONE  Final   Culture   Final    NO GROWTH 1 DAY   Report Status 02/28/2015 FINAL  Final  MRSA PCR Screening     Status: None   Collection Time: 02/27/15  9:42 AM  Result Value Ref Range Status   MRSA by PCR NEGATIVE NEGATIVE Final  Culture, respiratory (tracheal aspirate)     Status: None   Collection Time: 02/28/15  6:13 AM  Result Value Ref Range Status   Specimen Description TRACHEAL ASPIRATE  Final   Special Requests Immunocompromise  Final   Gram Stain   Final    NO ORGANISMS SEEN   Culture   Final    FEW CANDIDA ALBICANS   Report Status 03/03/2015 FINAL  Final     . antiseptic oral rinse  7 mL Mouth Rinse q12n4p  . chlorhexidine  15 mL Mouth Rinse BID  . guaiFENesin  600 mg Oral BID  . ipratropium-albuterol  3 mL Nebulization Q6H  . magic mouthwash w/lidocaine  5 mL Oral TID  . methadone  10 mg Per Tube 3 times per day  . pantoprazole (PROTONIX) IV  40 mg Intravenous QHS  . piperacillin-tazobactam (ZOSYN)  IV  3.375 g Intravenous 3 times per day  . potassium chloride  10 mEq Intravenous Q1 Hr x 3

## 2015-03-05 ENCOUNTER — Inpatient Hospital Stay (HOSPITAL_COMMUNITY): Payer: Medicaid Other

## 2015-03-05 LAB — PROTEIN, BODY FLUID

## 2015-03-05 LAB — LACTATE DEHYDROGENASE, PLEURAL OR PERITONEAL FLUID: LD FL: 81 U/L — AB (ref 3–23)

## 2015-03-05 LAB — BODY FLUID CELL COUNT WITH DIFFERENTIAL
LYMPHS FL: 34 %
MONOCYTE-MACROPHAGE-SEROUS FLUID: 52 % (ref 50–90)
Neutrophil Count, Fluid: 14 % (ref 0–25)
Total Nucleated Cell Count, Fluid: 178 cu mm (ref 0–1000)

## 2015-03-05 MED ORDER — IPRATROPIUM-ALBUTEROL 0.5-2.5 (3) MG/3ML IN SOLN
3.0000 mL | Freq: Three times a day (TID) | RESPIRATORY_TRACT | Status: DC
  Administered 2015-03-05 – 2015-03-09 (×11): 3 mL via RESPIRATORY_TRACT
  Filled 2015-03-05 (×12): qty 3

## 2015-03-05 MED ORDER — ZOLPIDEM TARTRATE 5 MG PO TABS
5.0000 mg | ORAL_TABLET | Freq: Once | ORAL | Status: AC
  Administered 2015-03-05: 5 mg via ORAL
  Filled 2015-03-05: qty 1

## 2015-03-05 NOTE — Consult Note (Signed)
WOC wound consult note Reason for Consult: 3 Stage II pressure injury sites to sacrum, left buttock x2, present on admission. Noted incontinence on underpads, stool and urine.  Unclear if this is baseline or due to excessive coughing.  Wound type: Stage II pressure injury with Moisture Associated Skin Damage to bilateral buttocks Pressure Ulcer POA: Yes Measurement: sacrum 1 cm x 1.5 cm x 0.1 cm Left buttocks 3 cm x 2.2 cm x 0.2 cm  1 cm x 1 cm x 0.1 cm Wound SEG:BTDV and moist Drainage (amount, consistency, odor) Minimal serous drainage.  Periwound:Erythema and tender to touch.  Incontinent of bowel and bladder at this time.  Dressing procedure/placement/frequency:Cleanse skin to buttocks with soap and water.  Barrier cream to this area.  Keep clean and dry.  No disposable briefs or underpads.  Will not follow at this time.  Please re-consult if needed.  Domenic Moras RN BSN [place] Pager [phone]

## 2015-03-05 NOTE — Procedures (Signed)
US guided diagnostic/therapeutic left thoracentesis performed yielding 1.4 liters slightly hazy, yellow fluid. A portion of the fluid was sent to the lab for preordered studies. F/u CXR pending. No immediate complications. Only the above amount of fluid was removed today secondary to pt coughing/chest discomfort.

## 2015-03-05 NOTE — Progress Notes (Signed)
CSW received consult for SNF placement, spoke with patient who confirmed that she lives with her daughter Haynes Kerns where she plans to return at discharge rather than go to SNF. Patient would be difficult to place in SNF as she has Medicaid only insurance, chemotherapy treatments in [place] & hx of heroin abuse. RNCM, Cookie aware of plan to return home with daughter at discharge.   No further CSW needs identified - CSW signing off.   Raynaldo Opitz, [facility] Clinical Social Worker cell #: [phone]
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
## 2015-03-05 NOTE — Progress Notes (Signed)
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 Initial CT scan noted large pleural effusions and patient felt to be in septic shock from healthcare associated pneumonia requiring pressor and ventilator support. Patient able to be extubated 9/21. She's been continued on broad-spectrum antibiotics with plans for thoracentesis on 9/26.  Patient complaints of pain. Breathing is still rough. Cough with whitish sputum  Assessment/Plan: Principal Problem:   Acute respiratory failure with hypoxia secondary to septic shock from Streptococcus healthcare associated pneumonia and secondary bilateral pleural effusions: Patient met criteria on admission for sepsis from leukocytosis, lactic acidosis, respiratory failure, tachypnea, tachycardia and source being pulmonary. Initially intubated now off of ventilator. With progressive effusion, for thoracentesis today. Thoracentesis initially held because of thrombocytopenia now stable. Continue on nebulizers plus antibiotics.   Active Problems:    Hypokalemia: Continuing to replace. Unclear why she keeps dropping her potassium. Recheck magnesium level.   Hypomagnesemia   Thrombocytopenia/neutropenia/anemia of chronic disease: Felt to be secondary to lymphoma. Currently white count normal with stable hemoglobin and platelets. 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    Pressure ulcer stage II: Seen by wound care, recommendations made   Code Status: Full code  Family  Communication:  Daughter and granddaughter at the bedside  Disposition Plan:  Anticipate discharge in next 1-2 days has breathing stabilizes   Consultants:  Critical care  Procedures:  Echo done 7/62:GBTDV 1 diastolic dysfxn  Thoracentesis done 9/26:1.4 L removed   Antibiotics: -  IV vancomycin 9/20-9/24 IV Zosyn 9/20-present   Objective: BP 129/83 mmHg  Pulse 101  Temp(Src) 99.9 F (37.7 C) (Oral)  Resp 20  Ht _0  (1.626 m)  Wt 60 kg (132 lb 4.4 oz)  BMI 22.69 kg/m2  SpO2 96%  Intake/Output Summary (Last 24 hours) at 03/05/15 1549 Last data filed at 03/05/15 1518  Gross per 24 hour  Intake    300 ml  Output   2250 ml  Net  -1950 ml   Filed Weights   03/03/15 0452 03/04/15 0529 03/05/15 0623  Weight: 61.3 kg (135 lb 2.3 oz) 61.1 kg (134 lb 11.2 oz) 60 kg (132 lb 4.4 oz)    Exam:   General:  Fatigued, alert and oriented 3, no acute distress  Cardiovascular: Regular rate and rhythm, S1-S2, occasional ectopic beat  Respiratory: Bilateral long expiratory phase  Abdomen: Soft, nontender, nondistended, positive bowel sounds Musculoskeletal: No clubbing cyanosis or edemaData Reviewed: Basic Metabolic Panel:  Recent Labs Lab 02/27/15 1102  02/28/15 0405 02/28/15 1925 03/01/15 0515 03/01/15 2028 03/02/15 0530 03/02/15 1750 03/03/15 0500 03/04/15 0800  NA  --   < > 135  --  136 139 141 139 139 135  K  --   < > 3.2*  --  2.9* 3.0* 2.7* 2.4* 3.0* 2.6*  CL  --   < > 108  --  106 106 102 98* 97* 91*  CO2  --   < > 17*  --  _1 33* 28  GLUCOSE  --   < > 109*  --  86 77 79 79 63* 46*  BUN  --   < > 20  --  _0 CREATININE  --   < > 0.99  --  0.75 0.69 0.79 0.95 1.05* 1.17*  CALCIUM  --   < > 6.7*  --  7.7* 8.2* 8.3* 8.4* 8.2* 8.0*  MG 1.2*  --  1.4* 2.8* 2.7*  --   --   --  1.6*  --   PHOS 3.7  --  3.0  --   --   --   --   --  2.1*  --   < > = values in this interval not displayed. Liver Function Tests:  Recent Labs Lab  02/27/15 0545  AST 47*  ALT 21  ALKPHOS 66  BILITOT 1.0  PROT 5.1*  ALBUMIN 2.7*    Recent Labs Lab 02/27/15 1102  LIPASE <10*  AMYLASE 31   No results for input(s): AMMONIA in the last 168 hours. CBC:  Recent Labs Lab 02/27/15 0545 02/28/15 0405 03/01/15 0515 03/02/15 0920 03/03/15 0500 03/04/15 0800  WBC 0.9* 0.8* 2.1* 5.2 4.7 9.7  NEUTROABS 0.0* 0.5* 1.8  --   --   --   HGB 11.5* 9.4* 8.1* 8.8* 9.9* 10.6*  HCT 36.3 28.9* 25.4* 27.8* 31.8* 33.6*  MCV 84.2 81.6 83.6 85.5 85.9 84.4  PLT 112* 44* 29* 51* 84* 149*   Cardiac Enzymes:    Recent Labs Lab 02/27/15 0545  TROPONINI 0.04*   BNP (last 3 results)  Recent Labs  02/27/15 0545  BNP 176.5*    ProBNP (last 3 results) No results for input(s): PROBNP in the last 8760 hours.  CBG:  Recent Labs Lab 02/27/15 1735 02/27/15 2125 02/28/15 0653 02/28/15 0812 02/28/15 1236  GLUCAP 138* 107* 102* 117* 105*    Recent Results (from the past 240 hour(s))  Culture, blood (routine x 2)     Status: None   Collection Time: 02/27/15  5:45 AM  Result Value Ref Range Status   Specimen Description BLOOD RIGHT HAND  Final   Special Requests BOTTLES DRAWN AEROBIC AND ANAEROBIC 5ML  Final   Culture   Final    NO GROWTH 5 DAYS Performed at [facility]    Report Status 03/04/2015 FINAL  Final  Culture, blood (routine x 2)     Status: None   Collection Time: 02/27/15  6:30 AM  Result Value Ref Range Status   Specimen Description BLOOD LEFT HAND  Final   Special Requests BOTTLES DRAWN AEROBIC AND ANAEROBIC 5ML  Final   Culture   Final    NO GROWTH 5 DAYS Performed at [facility]    Report Status 03/04/2015 FINAL  Final  Urine culture     Status: None   Collection Time: 02/27/15  9:41 AM  Result Value Ref Range Status   Specimen Description URINE, CATHETERIZED  Final   Special Requests NONE  Final   Culture   Final    NO GROWTH 1 DAY Performed at [facility]    Report Status  02/28/2015 FINAL  Final  MRSA PCR Screening     Status: None   Collection Time: 02/27/15  9:42 AM  Result Value Ref Range Status   MRSA by PCR NEGATIVE NEGATIVE Final    Comment:        The GeneXpert MRSA Assay (FDA approved for NASAL specimens  only), is one component of a comprehensive MRSA colonization surveillance program. It is not intended to diagnose MRSA infection nor to guide or monitor treatment for MRSA infections.   Culture, respiratory (tracheal aspirate)     Status: None   Collection Time: 02/28/15  6:13 AM  Result Value Ref Range Status   Specimen Description TRACHEAL ASPIRATE  Final   Special Requests Immunocompromised  Final   Gram Stain   Final    FEW WBC PRESENT,BOTH PMN AND MONONUCLEAR NO SQUAMOUS EPITHELIAL CELLS SEEN NO ORGANISMS SEEN Performed at [facility]    Culture   Final    FEW CANDIDA ALBICANS Performed at [facility]    Report Status 03/03/2015 FINAL  Final     Studies: Dg Chest 1 View  03/05/2015   CLINICAL DATA:  Status post left-sided thoracentesis.  EXAM: CHEST 1 VIEW  COMPARISON:  March 04, 2015.  FINDINGS: Stable cardiomediastinal silhouette. No pneumothorax is noted. Left-sided pleural effusion is significantly smaller status post thoracentesis. Mild right pleural effusion remains. Right-sided Port-A-Cath is unchanged in position.  IMPRESSION: No pneumothorax status post left-sided thoracentesis. Left pleural effusion is significantly smaller.   Electronically Signed   By: Marijo Conception, M.D.   On: 03/05/2015 13:42   US Thoracentesis Asp Pleural Space W/img Guide  03/05/2015   INDICATION: Lymphoma, dyspnea, pneumonia, bilateral pleural effusions, left greater than right. Request is made for diagnostic and therapeutic left thoracentesis.  EXAM: ULTRASOUND GUIDED DIAGNOSTIC AND THERAPEUTIC LEFT THORACENTESIS  COMPARISON:  None.  MEDICATIONS: None  COMPLICATIONS: None immediate  TECHNIQUE: Informed written consent was  obtained from the patient after a discussion of the risks, benefits and alternatives to treatment. A timeout was performed prior to the initiation of the procedure.  Initial ultrasound scanning demonstrates a moderate-to-large left pleural effusion. The lower chest was prepped and draped in the usual sterile fashion. 1% lidocaine was used for local anesthesia.  An ultrasound image was saved for documentation purposes. A 6 Fr Safe-T-Centesis catheter was introduced. The thoracentesis was performed. The catheter was removed and a dressing was applied. The patient tolerated the procedure well without immediate post procedural complication. The patient was escorted to have an upright chest radiograph.  FINDINGS: A total of approximately 1.4 liters of slightly hazy, yellow fluid was removed. Requested samples were sent to the laboratory. Only the above amount of fluid was removed at this time secondary to patient coughing/chest discomfort.  IMPRESSION: Successful ultrasound-guided diagnostic and therapeutic left sided thoracentesis yielding 1.4 liters of pleural fluid.  Read by: Rowe Robert, PA-C   Electronically Signed   By: Lucrezia Europe M.D.   On: 03/05/2015 13:30    Scheduled Meds: . antiseptic oral rinse  7 mL Mouth Rinse q12n4p  . chlorhexidine  15 mL Mouth Rinse BID  . guaiFENesin  600 mg Oral BID  . ipratropium-albuterol  3 mL Nebulization TID  . magic mouthwash w/lidocaine  5 mL Oral TID  . methadone  10 mg Per Tube 3 times per day  . pantoprazole (PROTONIX) IV  40 mg Intravenous QHS  . piperacillin-tazobactam (ZOSYN)  IV  3.375 g Intravenous 3 times per day    Continuous Infusions:    Time spent: 15 minutes  [facility] Pager [phone]. If 7PM-7AM, please contact night-coverage at [email or web address], password [facility] 03/05/2015, 3:49 PM  LOS: 6 days

## 2015-03-05 NOTE — Progress Notes (Signed)
Physical Therapy Treatment Patient Details Name: Joan Hoover MRN: [id number] DOB: 1960-12-14 Today's Date: 03/05/2015    History of Present Illness 54 yo female , life long smoker, reported to have been diagnosed with lymphoma 2 weeks ago but has a portacath In place and received chemo 2 weeks ago in [place] per Dr. Regis Bill. She presents to [facility] ED with complaints of nausea, abd pain and is found to be profoundly hypoxic . Dx of HCAP. Imaging showed fractures at all lumbar levels and T11, also insufficiency fxs at both sacral ala and vertickal fx of R pubic body and pubic rami.     PT Comments    Pt able to perform gait today with min A, spO2 94-95% throughout treatment on 2LO2.  Pt fatigues quickly and needs cues for deep breathing.  Pt will continue to benefit from continued rehab for safe d/c home.  Follow Up Recommendations  SNF     Equipment Recommendations  Rolling walker with 5" wheels    Recommendations for Other Services       Precautions / Restrictions Precautions Precautions: Fall Precaution Comments: monitor O2 Restrictions Weight Bearing Restrictions: No    Mobility  Bed Mobility Overal bed mobility: Modified Independent             General bed mobility comments: used rail, HOB up 30*  Transfers Overall transfer level: Needs assistance Equipment used: Rolling walker (2 wheeled)   Sit to Stand: Min assist Stand pivot transfers: Min assist       General transfer comment: min A to rise, steady assist during pivot.  spO2 94% on 2LO2 throughout treatment. sit to stand performed multiple times for strenghtening  Ambulation/Gait Ambulation/Gait assistance: Min assist Ambulation Distance (Feet): 50 Feet Assistive device: Rolling walker (2 wheeled)       General Gait Details: cues for deep breathing during gait, dyspnea 2/4   Stairs            Wheelchair Mobility    Modified Rankin (Stroke Patients Only)       Balance                                     Cognition Arousal/Alertness: Awake/alert Behavior During Therapy: WFL for tasks assessed/performed Overall Cognitive Status: Within Functional Limits for tasks assessed                      Exercises      General Comments        Pertinent Vitals/Pain Pain Assessment: No/denies pain    Home Living                      Prior Function            PT Goals (current goals can now be found in the care plan section) Progress towards PT goals: Progressing toward goals    Frequency  Min 3X/week    PT Plan Current plan remains appropriate    Co-evaluation             End of Session Equipment Utilized During Treatment: Gait belt;Oxygen Activity Tolerance: Patient limited by fatigue Patient left: in chair;with call bell/phone within reach     Time: 0835-0858 PT Time Calculation (min) (ACUTE ONLY): 23 min  Charges:  $Gait Training: 8-22 mins $Therapeutic Activity: 8-22 mins  G Codes:      DONAWERTH,KAREN 2015/03/10, 9:02 AM

## 2015-03-06 DIAGNOSIS — L8992 Pressure ulcer of unspecified site, stage 2: Secondary | ICD-10-CM

## 2015-03-06 LAB — URINALYSIS, ROUTINE W REFLEX MICROSCOPIC
BILIRUBIN URINE: NEGATIVE
GLUCOSE, UA: NEGATIVE mg/dL
Ketones, ur: NEGATIVE mg/dL
Nitrite: NEGATIVE
Protein, ur: NEGATIVE mg/dL
SPECIFIC GRAVITY, URINE: 1.01 (ref 1.005–1.030)
Urobilinogen, UA: 0.2 mg/dL (ref 0.0–1.0)
pH: 8.5 — ABNORMAL HIGH (ref 5.0–8.0)

## 2015-03-06 LAB — BASIC METABOLIC PANEL
Anion gap: 15 (ref 5–15)
BUN: 6 mg/dL (ref 6–20)
CALCIUM: 7.7 mg/dL — AB (ref 8.9–10.3)
CO2: 29 mmol/L (ref 22–32)
CREATININE: 0.9 mg/dL (ref 0.44–1.00)
Chloride: 90 mmol/L — ABNORMAL LOW (ref 101–111)
GLUCOSE: 45 mg/dL — AB (ref 65–99)
Potassium: 2.4 mmol/L — CL (ref 3.5–5.1)
Sodium: 134 mmol/L — ABNORMAL LOW (ref 135–145)

## 2015-03-06 LAB — URINE MICROSCOPIC-ADD ON

## 2015-03-06 LAB — MAGNESIUM: Magnesium: 1.5 mg/dL — ABNORMAL LOW (ref 1.7–2.4)

## 2015-03-06 MED ORDER — POTASSIUM CHLORIDE CRYS ER 20 MEQ PO TBCR
60.0000 meq | EXTENDED_RELEASE_TABLET | Freq: Once | ORAL | Status: AC
  Administered 2015-03-06: 60 meq via ORAL
  Filled 2015-03-06: qty 3

## 2015-03-06 MED ORDER — AMPICILLIN 500 MG PO CAPS
500.0000 mg | ORAL_CAPSULE | Freq: Four times a day (QID) | ORAL | Status: DC
  Administered 2015-03-06 – 2015-03-09 (×11): 500 mg via ORAL
  Filled 2015-03-06 (×17): qty 1

## 2015-03-06 MED ORDER — MAGNESIUM OXIDE 400 (241.3 MG) MG PO TABS
200.0000 mg | ORAL_TABLET | Freq: Every day | ORAL | Status: DC
  Administered 2015-03-06 – 2015-03-07 (×2): 200 mg via ORAL
  Filled 2015-03-06 (×3): qty 1

## 2015-03-06 MED ORDER — POTASSIUM CHLORIDE CRYS ER 20 MEQ PO TBCR
60.0000 meq | EXTENDED_RELEASE_TABLET | Freq: Two times a day (BID) | ORAL | Status: AC
  Administered 2015-03-06 (×2): 60 meq via ORAL
  Filled 2015-03-06 (×2): qty 3

## 2015-03-06 MED ORDER — MAGNESIUM SULFATE 2 GM/50ML IV SOLN
2.0000 g | Freq: Once | INTRAVENOUS | Status: AC
  Administered 2015-03-06: 2 g via INTRAVENOUS
  Filled 2015-03-06: qty 50

## 2015-03-06 NOTE — Progress Notes (Signed)
CRITICAL VALUE ALERT  Critical value received:  Potassium 2.4  Date of notification:  03/06/2015  Time of notification:  6:57 AM   Critical value read back:Yes.    Nurse who received alert:  Johnnye Lana, RN  MD notified (1st page):  Gevena Barre, MD  Time of first page: 7:26 AM   MD notified (2nd page): n/a  Time of second page: n/a  Responding MD:  Gevena Barre, MD  Time MD responded:  7:33 AM (spoke to oncoming RN)

## 2015-03-06 NOTE — Progress Notes (Addendum)
Nutrition Follow-up  DOCUMENTATION CODES:   Not applicable  INTERVENTION:  - RD will continue to monitor for needs  NUTRITION DIAGNOSIS:   Increased nutrient needs related to cancer and cancer related treatments as evidenced by estimated needs. -ongoing  GOAL:   Patient will meet greater than or equal to 90% of their needs -unmet  MONITOR:   PO intake, Labs, Weight trends, Skin, I & O's  ASSESSMENT:   54 yo female , life long smoker, reported to have been diagnosed with lymphoma 2 weeks ago but has a portacath In place and received chemo 2 weeks ago in [place] per Dr. Regis Bill. She presents to [facility] ED with complaints of nausea, abd pain and is found to be profoundly hypoxic despite 100% FIO2 and attempted NIMVS. Her PCO2 is 19, PH 7.48. PO2 146 on 100%. Lactic acid is >9 and her metabolic acidosis will most likely overwhelm her respiratory compensation. She is on levophed for hypotension(stress steroids ordered stat) and fluid resuscitation has been instituted. She is a poor historian and her EMR are not available in EPIC. Plan is to secure an airway, treat suspected infection with broad spectrum abx and check abd radiographs.  9/27 Pt ate 0% breakfast 9/25, 10% of breakfast and 0% lunch yesterday (9/26), and 50% of breakfast this AM, per chart review. Spoke with pt as she was eating lunch today and she had only taken a few bites. She reports that appetite is slowly improving and that she eats something at each meal, even if it is only a few bites.  Noted that pt had previously declined Ensure Enlive and Boost Plus; talked with her about other supplement options but she declines these offers as well.  She states she is not having abdominal pain or nausea related to PO intakes but she is having abdominal pain which she attributes to complications with catheter.  Not meeting needs. Medications reviewed. Labs reviewed; Na: 134 mmol/L, K: 2.4 mmol/L, Cl: 90 mmol/L, Ca: 7.7 mg/dL,  Mg: 1.5 mg/dL.  ADDENDUM: Pt had thoracentesis yesterday (9/26) with 1.4 L removed.    9/22 - Pt extubated 9/21. - Pt currently order full liquid diet.  - Pt continues to have soreness in her throat. SLP evaluated and recommended full liquids.  - Pt states that she does not like Ensure/Boost drinks, encouraged her to try chocolate milk with meals.   9/21 - RD to initiate and manage TF.  - Patient is currently intubated on ventilator support - MV: 8.8 L/min; Propofol: none - Pt remains awake, alert despite high dose Fentanyl; noted hx of IV drug abuse.  - No family present to provide hx about weight trends or intakes PTA and no such information available in the chart. - Noted recent hx of lymphoma and last chemo was 2 weeks PTA; needs will need to be adjusted to account for this s/p extubation. - No muscle or fat wasting noted.   Diet Order:  Diet regular Room service appropriate?: Yes; Fluid consistency:: Thin  Skin:    Stage 2 pressure ulcers: L sacrum x2, mid sacrum, R sacrum  Last BM:  9/27  Height:   Ht Readings from Last 1 Encounters:  02/27/15 5\' 4"  (1.626 m)    Weight:   Wt Readings from Last 1 Encounters:  03/06/15 126 lb 12.2 oz (57.5 kg)    Ideal Body Weight:  54.54 kg (kg)  BMI:  Body mass index is 21.75 kg/(m^2).  Estimated Nutritional Needs:   Kcal:  1017-5102  Protein:  85-95g  Fluid:  1.7L/day  EDUCATION NEEDS:   No education needs identified at this time     Jarome Matin, RD, [facility] Inpatient Clinical Dietitian Pager # [phone] After hours/weekend pager # [phone]

## 2015-03-06 NOTE — Progress Notes (Signed)
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 Initial CT scan noted large pleural effusions and patient felt to be in septic shock from healthcare associated pneumonia requiring pressor and ventilator support. Patient able to be extubated 9/21. Patient's blood cultures positive for Streptococcus.  Transitioned from Zosyn to by mouth antibiotics. Patient underwent thoracentesis done 9/26 removing 1.6 L both for diagnostic and therapeutic purposes. To date, no significant growth or cytology.  Patient complains of dysuria today. She feels very weak. She feels like breathing is better.   Assessment/plan Acute respiratory failure with hypoxia secondary to septic shock from Streptococcus healthcare associated pneumonia and secondary bilateral pleural effusions: Patient met criteria on admission for sepsis from leukocytosis, lactic acidosis, respiratory failure, tachypnea, tachycardia and source being pulmonary. Initially intubated now off of ventilator. With progressive effusion, for thoracentesis today. Thoracentesis initially held because of thrombocytopenia now stable. Continue on nebulizers plus antibiotics.   Active Problems:    Hypokalemia: Continuing to replace. Unclear why she keeps dropping her potassium.   Hypomagnesemia: Also low, replaced.   Thrombocytopenia/neutropenia/anemia of chronic disease: Felt to be secondary to lymphoma. Currently white count normal with stable hemoglobin and platelets. Avoiding pharmaceutical DVT prophylaxis    Adrenal insufficiency: Stable       IV drug abuse: On methadone.  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    Pressure ulcer stage II: Seen by wound care, recommendations made   Code Status: Full code  Family Communication:  Left msg wth family  Disposition Plan:  Anticipate discharge tomorrow, awaiting PT eval, oxygen sats on ambulation elbow she would likely benefit more so at his skilled nursing facility, given her Medicaid insurance, history of IV drug abuse and ongoing chemotherapy in [place], she will be extremely difficult to place and so plans for home health for services .     Consultants:  Critical care  Procedures:  Echo done 3/49:ZPHXT 1 diastolic dysfxn  Thoracentesis done 9/26:1.4 L removed   Antibiotics: -  IV vancomycin 9/20-9/24 IV Zosyn 9/20-9/27 Augmentin 9/27-9/30   Objective: BP 116/63 mmHg  Pulse 99  Temp(Src) 97.9 F (36.6 C) (Oral)  Resp 16  Ht 5' 4"  (1.626 m)  Wt 57.5 kg (126 lb 12.2 oz)  BMI 21.75 kg/m2  SpO2 97%  Intake/Output Summary (Last 24 hours) at 03/06/15 1624 Last data filed at 03/06/15 0900  Gross per 24 hour  Intake    460 ml  Output    550 ml  Net    -90 ml   Filed Weights   03/04/15 0529 03/05/15 0623 03/06/15 0452  Weight: 61.1 kg (134 lb 11.2 oz) 60 kg (132 lb 4.4 oz) 57.5 kg (126 lb 12.2 oz)    Exam:   General:  Fatigued, alert and oriented 3, no acute distress  Cardiovascular: Regular rate and rhythm, S1-S2, occasional ectopic beat  Respiratory: Decreased breath sounds throughout  Abdomen: Soft, nondistended, nontender,, positive bowel sounds Musculoskeletal: No clubbing cyanosis or edema  Data Reviewed: Basic Metabolic Panel:  Recent Labs Lab 02/28/15 0405 02/28/15 1925 03/01/15 0515  03/02/15 0530 03/02/15 1750 03/03/15 0500 03/04/15 0800  03/06/15 0455  NA 135  --  136  < > 141 139 139 135 134*  K 3.2*  --  2.9*  < > 2.7* 2.4* 3.0* 2.6* 2.4*  CL 108  --  106  < > 102 98* 97* 91* 90*  CO2 17*  --  22  < > 27 31 33* 28 29  GLUCOSE  109*  --  86  < > 79 79 63* 46* 45*  BUN 20  --  15  < > 14 13 12 11 6   CREATININE 0.99  --  0.75  < > 0.79 0.95 1.05* 1.17* 0.90  CALCIUM 6.7*  --  7.7*  < > 8.3* 8.4* 8.2* 8.0* 7.7*  MG 1.4* 2.8* 2.7*  --   --   --  1.6*  --  1.5*  PHOS 3.0  --   --   --   --   --  2.1*  --   --   < > = values in this interval not displayed. Liver Function Tests: No results for input(s): AST, ALT, ALKPHOS, BILITOT, PROT, ALBUMIN in the last 168 hours. No results for input(s): LIPASE, AMYLASE in the last 168 hours. No results for input(s): AMMONIA in the last 168 hours. CBC:  Recent Labs Lab 02/28/15 0405 03/01/15 0515 03/02/15 0920 03/03/15 0500 03/04/15 0800  WBC 0.8* 2.1* 5.2 4.7 9.7  NEUTROABS 0.5* 1.8  --   --   --   HGB 9.4* 8.1* 8.8* 9.9* 10.6*  HCT 28.9* 25.4* 27.8* 31.8* 33.6*  MCV 81.6 83.6 85.5 85.9 84.4  PLT 44* 29* 51* 84* 149*   Cardiac Enzymes:   No results for input(s): CKTOTAL, CKMB, CKMBINDEX, TROPONINI in the last 168 hours. BNP (last 3 results)  Recent Labs  02/27/15 0545  BNP 176.5*    ProBNP (last 3 results) No results for input(s): PROBNP in the last 8760 hours.  CBG:  Recent Labs Lab 02/27/15 1735 02/27/15 2125 02/28/15 0653 02/28/15 0812 02/28/15 1236  GLUCAP 138* 107* 102* 117* 105*    Recent Results (from the past 240 hour(s))  Culture, blood (routine x 2)     Status: None   Collection Time: 02/27/15  5:45 AM  Result Value Ref Range Status   Specimen Description BLOOD RIGHT HAND  Final   Special Requests BOTTLES DRAWN AEROBIC AND ANAEROBIC 5ML  Final   Culture   Final    NO GROWTH 5 DAYS Performed at [facility]    Report Status 03/04/2015 FINAL  Final  Culture, blood (routine x 2)     Status: None   Collection Time: 02/27/15  6:30 AM  Result Value Ref Range Status   Specimen Description BLOOD LEFT HAND  Final   Special Requests BOTTLES DRAWN AEROBIC AND ANAEROBIC 5ML  Final   Culture   Final    NO GROWTH 5 DAYS Performed at  [facility]    Report Status 03/04/2015 FINAL  Final  Urine culture     Status: None   Collection Time: 02/27/15  9:41 AM  Result Value Ref Range Status   Specimen Description URINE, CATHETERIZED  Final   Special Requests NONE  Final   Culture   Final    NO GROWTH 1 DAY Performed at [facility]    Report Status 02/28/2015 FINAL  Final  MRSA PCR Screening     Status: None   Collection Time: 02/27/15  9:42 AM  Result Value Ref Range Status  MRSA by PCR NEGATIVE NEGATIVE Final    Comment:        The GeneXpert MRSA Assay (FDA approved for NASAL specimens only), is one component of a comprehensive MRSA colonization surveillance program. It is not intended to diagnose MRSA infection nor to guide or monitor treatment for MRSA infections.   Culture, respiratory (tracheal aspirate)     Status: None   Collection Time: 02/28/15  6:13 AM  Result Value Ref Range Status   Specimen Description TRACHEAL ASPIRATE  Final   Special Requests Immunocompromised  Final   Gram Stain   Final    FEW WBC PRESENT,BOTH PMN AND MONONUCLEAR NO SQUAMOUS EPITHELIAL CELLS SEEN NO ORGANISMS SEEN Performed at [facility]    Culture   Final    FEW CANDIDA ALBICANS Performed at [facility]    Report Status 03/03/2015 FINAL  Final  Culture, body fluid-bottle     Status: None (Preliminary result)   Collection Time: 03/05/15  1:16 PM  Result Value Ref Range Status   Specimen Description FLUID PLEURAL  Final   Special Requests BOTTLES DRAWN AEROBIC AND ANAEROBIC 10CC  Final   Gram Stain   Final    CYTOSPIN SMEAR WBC PRESENT, PREDOMINANTLY MONONUCLEAR NO ORGANISMS SEEN    Culture   Final    NO GROWTH < 24 HOURS Performed at [facility]    Report Status PENDING  Incomplete     Studies: No results found.  Scheduled Meds: . ampicillin  500 mg Oral 4 times per day  . antiseptic oral rinse  7 mL Mouth Rinse q12n4p  . chlorhexidine  15 mL Mouth Rinse BID    . guaiFENesin  600 mg Oral BID  . ipratropium-albuterol  3 mL Nebulization TID  . magic mouthwash w/lidocaine  5 mL Oral TID  . magnesium oxide  200 mg Oral Daily  . methadone  10 mg Per Tube 3 times per day    Continuous Infusions:    Time spent: 15 minutes  [facility] Pager [phone]. If 7PM-7AM, please contact night-coverage at [email or web address], password [facility] 03/06/2015, 4:24 PM  LOS: 7 days

## 2015-03-06 NOTE — Progress Notes (Addendum)
Call received from Hospitalist provider regarding abnormal K+ 2.4, Order received for 60 meq Kdur one dose now.

## 2015-03-07 LAB — GLUCOSE, CAPILLARY: GLUCOSE-CAPILLARY: 99 mg/dL (ref 65–99)

## 2015-03-07 LAB — BASIC METABOLIC PANEL
Anion gap: 7 (ref 5–15)
CO2: 27 mmol/L (ref 22–32)
CREATININE: 0.86 mg/dL (ref 0.44–1.00)
Calcium: 8.3 mg/dL — ABNORMAL LOW (ref 8.9–10.3)
Chloride: 97 mmol/L — ABNORMAL LOW (ref 101–111)
GFR calc Af Amer: >60 mL/min (ref >60)
GLUCOSE: 96 mg/dL (ref 65–99)
Potassium: 4.2 mmol/L (ref 3.5–5.1)
SODIUM: 131 mmol/L — AB (ref 135–145)

## 2015-03-07 LAB — MAGNESIUM: Magnesium: 1.7 mg/dL (ref 1.7–2.4)

## 2015-03-07 MED ORDER — MAGNESIUM SULFATE 2 GM/50ML IV SOLN
2.0000 g | Freq: Once | INTRAVENOUS | Status: AC
  Administered 2015-03-07: 2 g via INTRAVENOUS
  Filled 2015-03-07: qty 50

## 2015-03-07 MED ORDER — FLUCONAZOLE 100 MG PO TABS
200.0000 mg | ORAL_TABLET | Freq: Once | ORAL | Status: AC
  Administered 2015-03-07: 200 mg via ORAL
  Filled 2015-03-07: qty 2

## 2015-03-07 NOTE — Progress Notes (Signed)
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 Initial CT scan noted large pleural effusions and patient felt to be in septic shock from healthcare associated pneumonia requiring pressor and ventilator support. Patient able to be extubated 9/21. Patient's blood cultures positive for Streptococcus.  Transitioned from Zosyn to by mouth antibiotics. Patient underwent thoracentesis done 9/26 removing 1.6 L both for diagnostic and therapeutic purposes. To date, no significant growth or cytology.  Patient reports feeling weak and exhausted. She wants to know if she can go to rehabilitation.    Assessment/plan Acute respiratory failure with hypoxia secondary to septic shock from Streptococcus healthcare associated pneumonia and secondary bilateral pleural effusions: Patient met criteria on admission for sepsis from leukocytosis, lactic acidosis, respiratory failure, tachypnea, tachycardia and source being pulmonary. Initially intubated now off of ventilator. She also underwent thoracocentensis . Pleural analysis shows 178 of wbc's and the cultures are pending.  Continue on nebulizers plus antibiotics.   Active Problems:    Hypokalemia: Continuing to replace. Unclear why she keeps dropping her potassium. Repeat in am is normal.    Hypomagnesemia: Also low, replaced today, repeat in am.    Thrombocytopenia/neutropenia/anemia of chronic disease: Felt to be secondary to lymphoma. Currently white count normal with stable hemoglobin and platelets. Avoiding pharmaceutical DVT prophylaxis    Adrenal insufficiency: Stable       IV drug abuse: On methadone.  Stopped prn  fentanyl    Diffuse large B cell lymphoma: On treatment with [facility] as of 02/19/15 (HIV neg 8/19).    Pressure ulcer stage II: Seen by wound care, recommendations made   Code Status: Full code  Family Communication:  None at bedside.   Disposition Plan:  Home with home health.    Consultants:  Critical care  Procedures:  Echo done 0/32:ZYYQM 1 diastolic dysfxn  Thoracentesis done 9/26:1.4 L removed   Antibiotics: -  IV vancomycin 9/20-9/24 IV Zosyn 9/20-9/27 Augmentin 9/27-9/30   Objective: BP 122/84 mmHg  Pulse 106  Temp(Src) 98.5 F (36.9 C) (Oral)  Resp 20  Ht 5' 4"  (1.626 m)  Wt 57 kg (125 lb 10.6 oz)  BMI 21.56 kg/m2  SpO2 96%  Intake/Output Summary (Last 24 hours) at 03/07/15 1638 Last data filed at 03/07/15 1450  Gross per 24 hour  Intake    240 ml  Output      0 ml  Net    240 ml   Filed Weights   03/05/15 0623 03/06/15 0452 03/07/15 0533  Weight: 60 kg (132 lb 4.4 oz) 57.5 kg (126 lb 12.2 oz) 57 kg (125 lb 10.6 oz)    Exam:   General:  Fatigued, alert and oriented 3, no acute distress  Cardiovascular: Regular rate and rhythm, S1-S2, occasional ectopic beat  Respiratory: Decreased breath sounds throughout  Abdomen: Soft, nondistended, nontender,, positive bowel sounds Musculoskeletal: No clubbing cyanosis or edema  Data Reviewed: Basic Metabolic Panel:  Recent Labs Lab 02/28/15 1925 03/01/15 0515  03/02/15 1750 03/03/15 0500 03/04/15 0800 03/06/15 0455 03/07/15 1027  NA  --  136  < > 139 139 135 134* 131*  K  --  2.9*  < > 2.4* 3.0* 2.6* 2.4* 4.2  CL  --  106  < > 98* 97* 91* 90* 97*  CO2  --  22  < > 31 33* 28 29 27   GLUCOSE  --  86  < > 79 63* 46* 45* 96  BUN  --  15  < > 13 12 11 6  <5*  CREATININE  --  0.75  < > 0.95 1.05* 1.17* 0.90 0.86  CALCIUM  --  7.7*  < > 8.4* 8.2* 8.0* 7.7* 8.3*  MG 2.8* 2.7*  --   --  1.6*  --  1.5* 1.7  PHOS  --   --   --   --  2.1*  --   --   --   < > = values in this interval not  displayed. Liver Function Tests: No results for input(s): AST, ALT, ALKPHOS, BILITOT, PROT, ALBUMIN in the last 168 hours. No results for input(s): LIPASE, AMYLASE in the last 168 hours. No results for input(s): AMMONIA in the last 168 hours. CBC:  Recent Labs Lab 03/01/15 0515 03/02/15 0920 03/03/15 0500 03/04/15 0800  WBC 2.1* 5.2 4.7 9.7  NEUTROABS 1.8  --   --   --   HGB 8.1* 8.8* 9.9* 10.6*  HCT 25.4* 27.8* 31.8* 33.6*  MCV 83.6 85.5 85.9 84.4  PLT 29* 51* 84* 149*   Cardiac Enzymes:   No results for input(s): CKTOTAL, CKMB, CKMBINDEX, TROPONINI in the last 168 hours. BNP (last 3 results)  Recent Labs  02/27/15 0545  BNP 176.5*    ProBNP (last 3 results) No results for input(s): PROBNP in the last 8760 hours.  CBG:  Recent Labs Lab 03/07/15 0942  GLUCAP 99    Recent Results (from the past 240 hour(s))  Culture, blood (routine x 2)     Status: None   Collection Time: 02/27/15  5:45 AM  Result Value Ref Range Status   Specimen Description BLOOD RIGHT HAND  Final   Special Requests BOTTLES DRAWN AEROBIC AND ANAEROBIC 5ML  Final   Culture   Final    NO GROWTH 5 DAYS Performed at [facility]    Report Status 03/04/2015 FINAL  Final  Culture, blood (routine x 2)     Status: None   Collection Time: 02/27/15  6:30 AM  Result Value Ref Range Status   Specimen Description BLOOD LEFT HAND  Final   Special Requests BOTTLES DRAWN AEROBIC AND ANAEROBIC 5ML  Final   Culture   Final    NO GROWTH 5 DAYS Performed at [facility]    Report Status 03/04/2015 FINAL  Final  Urine culture     Status: None   Collection Time: 02/27/15  9:41 AM  Result Value Ref Range Status   Specimen Description URINE, CATHETERIZED  Final   Special Requests NONE  Final   Culture   Final    NO GROWTH 1 DAY Performed at [facility]    Report Status 02/28/2015 FINAL  Final  MRSA PCR Screening     Status: None   Collection Time: 02/27/15  9:42 AM  Result  Value Ref Range Status   MRSA by PCR NEGATIVE NEGATIVE Final    Comment:        The GeneXpert MRSA Assay (FDA approved for NASAL specimens only), is one component of a comprehensive MRSA colonization surveillance program. It is not intended to diagnose MRSA infection nor to guide or monitor treatment for MRSA infections.   Culture, respiratory (tracheal aspirate)     Status: None  Collection Time: 02/28/15  6:13 AM  Result Value Ref Range Status   Specimen Description TRACHEAL ASPIRATE  Final   Special Requests Immunocompromised  Final   Gram Stain   Final    FEW WBC PRESENT,BOTH PMN AND MONONUCLEAR NO SQUAMOUS EPITHELIAL CELLS SEEN NO ORGANISMS SEEN Performed at [facility]    Culture   Final    FEW CANDIDA ALBICANS Performed at [facility]    Report Status 03/03/2015 FINAL  Final  Culture, body fluid-bottle     Status: None (Preliminary result)   Collection Time: 03/05/15  1:16 PM  Result Value Ref Range Status   Specimen Description FLUID PLEURAL  Final   Special Requests BOTTLES DRAWN AEROBIC AND ANAEROBIC 10CC  Final   Gram Stain   Final    CYTOSPIN SMEAR WBC PRESENT, PREDOMINANTLY MONONUCLEAR NO ORGANISMS SEEN    Culture   Final    NO GROWTH 2 DAYS Performed at [facility]    Report Status PENDING  Incomplete     Studies: No results found.  Scheduled Meds: . ampicillin  500 mg Oral 4 times per day  . antiseptic oral rinse  7 mL Mouth Rinse q12n4p  . chlorhexidine  15 mL Mouth Rinse BID  . guaiFENesin  600 mg Oral BID  . ipratropium-albuterol  3 mL Nebulization TID  . magic mouthwash w/lidocaine  5 mL Oral TID  . magnesium sulfate 1 - 4 g bolus IVPB  2 g Intravenous Once  . methadone  10 mg Per Tube 3 times per day    Continuous Infusions:    Time spent: 15 minutes  [facility] Pager [phone]. If 7PM-7AM, please contact night-coverage at [email or web address], password [facility] 03/07/2015, 4:38 PM  LOS: 8 days

## 2015-03-07 NOTE — Progress Notes (Signed)
Physical Therapy Treatment Patient Details Name: Joan Hoover MRN: [id number] DOB: [birth year] Today's Date: 03/07/2015    History of Present Illness 54 yo female , life long smoker, reported to have been diagnosed with lymphoma 2 weeks ago but has a portacath In place and received chemo 2 weeks ago in [place]  per Dr. Regis Bill. She presents to [facility] ED with complaints of nausea, abd pain and is found to be profoundly hypoxic . Dx of HCAP. Imaging showed fractures at all lumbar levels and T11, also insufficiency fxs at both sacral ala and vertical fx of R pubic body and pubic rami.     PT Comments    Pt able to tolerate good distance ambulation today and SpO2 93% on room air during gait.  Per chart review and pt, plan is for pt to d/c home at this time.    Follow Up Recommendations  Home health PT;Supervision for mobility/OOB     Equipment Recommendations  Rolling walker with 5" wheels    Recommendations for Other Services       Precautions / Restrictions Precautions Precautions: Fall Precaution Comments: monitor O2    Mobility  Bed Mobility Overal bed mobility: Modified Independent                Transfers Overall transfer level: Needs assistance Equipment used: Rolling walker (2 wheeled) Transfers: Sit to/from Stand Sit to Stand: Min guard         General transfer comment: min/guard for safety  Ambulation/Gait Ambulation/Gait assistance: Min guard Ambulation Distance (Feet): 180 Feet Assistive device: Rolling walker (2 wheeled) Gait Pattern/deviations: Step-through pattern;Decreased stride length     General Gait Details: verbal cues for breathing, pt reports improved breathing today, denies SOB, SpO2 93% room air during gait, HR 130 bpm   Stairs            Wheelchair Mobility    Modified Rankin (Stroke Patients Only)       Balance                                    Cognition Arousal/Alertness: Awake/alert Behavior  During Therapy: WFL for tasks assessed/performed Overall Cognitive Status: Within Functional Limits for tasks assessed                      Exercises      General Comments        Pertinent Vitals/Pain Pain Assessment: No/denies pain    Home Living                      Prior Function            PT Goals (current goals can now be found in the care plan section) Acute Rehab PT Goals PT Goal Formulation: With patient Time For Goal Achievement: 04/11/15 Potential to Achieve Goals: Good Progress towards PT goals: Progressing toward goals    Frequency  Min 3X/week    PT Plan Discharge plan needs to be updated    Co-evaluation             End of Session Equipment Utilized During Treatment: Gait belt Activity Tolerance: Patient limited by fatigue Patient left: with call bell/phone within reach (window seat to look out window)     Time: 1005-1023 PT Time Calculation (min) (ACUTE ONLY): 18 min  Charges:  $Gait Training: 8-22 mins  G Codes:      LEMYRE,KATHrine E Mar 22, 2015, 11:09 AM Carmelia Bake, PT, DPT March 22, 2015 Pager: [phone]  .

## 2015-03-07 NOTE — Progress Notes (Addendum)
Spoke with pt concerning discharge and [place] needs. Pt will not qualify for SNF. Pt is okay with going home with [facility]. Will need HHRN/NA orders please. Pt selected [facility] referral given.

## 2015-03-08 ENCOUNTER — Inpatient Hospital Stay (HOSPITAL_COMMUNITY): Payer: Medicaid Other

## 2015-03-08 LAB — URINALYSIS, ROUTINE W REFLEX MICROSCOPIC
BILIRUBIN URINE: NEGATIVE
Glucose, UA: NEGATIVE mg/dL
Ketones, ur: NEGATIVE mg/dL
NITRITE: NEGATIVE
Protein, ur: 30 mg/dL — AB
SPECIFIC GRAVITY, URINE: 1.012 (ref 1.005–1.030)
UROBILINOGEN UA: 0.2 mg/dL (ref 0.0–1.0)
pH: 7.5 (ref 5.0–8.0)

## 2015-03-08 LAB — URINE MICROSCOPIC-ADD ON

## 2015-03-08 LAB — PHOSPHORUS: PHOSPHORUS: 3.2 mg/dL (ref 2.5–4.6)

## 2015-03-08 LAB — GRAM STAIN

## 2015-03-08 LAB — BASIC METABOLIC PANEL
Anion gap: 7 (ref 5–15)
BUN: 6 mg/dL (ref 6–20)
CALCIUM: 8.2 mg/dL — AB (ref 8.9–10.3)
CHLORIDE: 97 mmol/L — AB (ref 101–111)
CO2: 25 mmol/L (ref 22–32)
CREATININE: 0.9 mg/dL (ref 0.44–1.00)
GFR calc non Af Amer: >60 mL/min (ref >60)
Glucose, Bld: 95 mg/dL (ref 65–99)
Potassium: 3.8 mmol/L (ref 3.5–5.1)
SODIUM: 129 mmol/L — AB (ref 135–145)

## 2015-03-08 LAB — MAGNESIUM: Magnesium: 1.8 mg/dL (ref 1.7–2.4)

## 2015-03-08 MED ORDER — BENZONATATE 100 MG PO CAPS
200.0000 mg | ORAL_CAPSULE | Freq: Three times a day (TID) | ORAL | Status: DC | PRN
  Administered 2015-03-08: 200 mg via ORAL
  Filled 2015-03-08: qty 2

## 2015-03-08 MED ORDER — GUAIFENESIN-DM 100-10 MG/5ML PO SYRP
5.0000 mL | ORAL_SOLUTION | ORAL | Status: DC | PRN
  Administered 2015-03-08: 5 mL via ORAL
  Filled 2015-03-08: qty 10

## 2015-03-08 NOTE — Progress Notes (Signed)
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 Initial CT scan noted large pleural effusions and patient felt to be in septic shock from healthcare associated pneumonia requiring pressor and ventilator support. Patient able to be extubated 9/21. Patient's blood cultures positive for Streptococcus.  Transitioned from Zosyn to by mouth antibiotics. Patient underwent thoracentesis done 9/26 removing 1.6 L both for diagnostic and therapeutic purposes. To date, no significant growth or cytology.  Patient reports feeling weak and exhausted. She wants to know if she can go to rehabilitation.    Assessment/plan Acute respiratory failure with hypoxia secondary to septic shock from Streptococcus healthcare associated pneumonia and secondary bilateral pleural effusions: Patient met criteria on admission for sepsis from leukocytosis, lactic acidosis, respiratory failure, tachypnea, tachycardia and source being pulmonary. Initially intubated now off of ventilator. She also underwent thoracocentensis . Pleural analysis shows 178 of wbc's and the cultures are negative so far.  Continue on nebulizers plus antibiotics.   Active Problems:    Hypokalemia: Continuing to replace. Unclear why she keeps dropping her potassium. Repeat in am is normal.    Hypomagnesemia: Also low, replaced today, repeat in am.    Thrombocytopenia/neutropenia/anemia of chronic disease: Felt to be secondary to lymphoma. Currently white count normal with stable hemoglobin and platelets. Avoiding pharmaceutical DVT prophylaxis    Adrenal insufficiency: Stable       IV drug abuse: On methadone.  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    Pressure ulcer stage II: Seen by wound care, recommendations made. Left pleural effusion; S/p thoracocentesis, probably a transudate, pleural fluid protein less than 3. LDH is 81. Gram stain was not obtained. PH was not obtained.  So fall cultures have been negative.   Fever overnight: Repeat CXR does not show any new consolidation , repeat UA and urine cultures ordered.  Continue with ampicillin.    Code Status: Full code  Family Communication:  None at bedside.   Disposition Plan:  Home with home health.    Consultants:  Critical care  Procedures:  Echo done 5/37:SMOLM 1 diastolic dysfxn  Thoracentesis done 9/26:1.4 L removed   Antibiotics: -  IV vancomycin 9/20-9/24 IV Zosyn 9/20-9/27 Augmentin 9/27-9/30   Objective: BP 112/70 mmHg  Pulse 95  Temp(Src) 99.6 F (37.6 C) (Oral)  Resp 20  Ht _0  (1.626 m)  Wt 55.7 kg (122 lb 12.7 oz)  BMI 21.07 kg/m2  SpO2 91%  Intake/Output Summary (Last 24 hours) at 03/08/15 1309 Last data filed at 03/08/15 0844  Gross per 24 hour  Intake    660 ml  Output      0 ml  Net    660 ml   Filed Weights   03/06/15 0452 03/07/15 0533 03/08/15 0613  Weight: 57.5 kg (126 lb 12.2 oz) 57 kg (125 lb 10.6 oz) 55.7 kg (122 lb 12.7 oz)    Exam:   General:  Fatigued, alert and oriented 3, no acute distress  Cardiovascular: Regular rate and rhythm, S1-S2, occasional ectopic beat  Respiratory: Decreased breath sounds throughout.   Abdomen: Soft, nondistended, nontender,, positive bowel sounds Musculoskeletal: No  clubbing cyanosis or edema  Data Reviewed: Basic Metabolic Panel:  Recent Labs Lab 03/02/15 1750 03/03/15 0500 03/04/15 0800 03/06/15 0455 03/07/15 1027  NA 139 139 135 134* 131*  K 2.4* 3.0* 2.6* 2.4* 4.2  CL 98* 97* 91* 90* 97*  CO2 31 33* _0 GLUCOSE 79 63* 46* 45* 96  BUN _1 <5*   CREATININE 0.95 1.05* 1.17* 0.90 0.86  CALCIUM 8.4* 8.2* 8.0* 7.7* 8.3*  MG  --  1.6*  --  1.5* 1.7  PHOS  --  2.1*  --   --   --    Liver Function Tests: No results for input(s): AST, ALT, ALKPHOS, BILITOT, PROT, ALBUMIN in the last 168 hours. No results for input(s): LIPASE, AMYLASE in the last 168 hours. No results for input(s): AMMONIA in the last 168 hours. CBC:  Recent Labs Lab 03/02/15 0920 03/03/15 0500 03/04/15 0800  WBC 5.2 4.7 9.7  HGB 8.8* 9.9* 10.6*  HCT 27.8* 31.8* 33.6*  MCV 85.5 85.9 84.4  PLT 51* 84* 149*   Cardiac Enzymes:   No results for input(s): CKTOTAL, CKMB, CKMBINDEX, TROPONINI in the last 168 hours. BNP (last 3 results)  Recent Labs  02/27/15 0545  BNP 176.5*    ProBNP (last 3 results) No results for input(s): PROBNP in the last 8760 hours.  CBG:  Recent Labs Lab 03/07/15 0942  GLUCAP 99    Recent Results (from the past 240 hour(s))  Culture, blood (routine x 2)     Status: None   Collection Time: 02/27/15  5:45 AM  Result Value Ref Range Status   Specimen Description BLOOD RIGHT HAND  Final   Special Requests BOTTLES DRAWN AEROBIC AND ANAEROBIC 5ML  Final   Culture   Final    NO GROWTH 5 DAYS Performed at [facility]    Report Status 03/04/2015 FINAL  Final  Culture, blood (routine x 2)     Status: None   Collection Time: 02/27/15  6:30 AM  Result Value Ref Range Status   Specimen Description BLOOD LEFT HAND  Final   Special Requests BOTTLES DRAWN AEROBIC AND ANAEROBIC 5ML  Final   Culture   Final    NO GROWTH 5 DAYS Performed at [facility]    Report Status 03/04/2015 FINAL  Final  Urine culture     Status: None   Collection Time: 02/27/15  9:41 AM  Result Value Ref Range Status   Specimen Description URINE, CATHETERIZED  Final   Special Requests NONE  Final   Culture   Final    NO GROWTH 1 DAY Performed at [facility]    Report Status 02/28/2015 FINAL  Final  MRSA PCR Screening     Status:  None   Collection Time: 02/27/15  9:42 AM  Result Value Ref Range Status   MRSA by PCR NEGATIVE NEGATIVE Final    Comment:        The GeneXpert MRSA Assay (FDA approved for NASAL specimens only), is one component of a comprehensive MRSA colonization surveillance program. It is not intended to diagnose MRSA infection nor to guide or monitor treatment for MRSA infections.   Culture, respiratory (tracheal aspirate)     Status: None   Collection Time: 02/28/15  6:13 AM  Result Value Ref Range Status   Specimen Description TRACHEAL ASPIRATE  Final   Special Requests Immunocompromised  Final   Gram Stain   Final    FEW  WBC PRESENT,BOTH PMN AND MONONUCLEAR NO SQUAMOUS EPITHELIAL CELLS SEEN NO ORGANISMS SEEN Performed at [facility]    Culture   Final    FEW CANDIDA ALBICANS Performed at [facility]    Report Status 03/03/2015 FINAL  Final  Culture, body fluid-bottle     Status: None (Preliminary result)   Collection Time: 03/05/15  1:16 PM  Result Value Ref Range Status   Specimen Description FLUID PLEURAL  Final   Special Requests BOTTLES DRAWN AEROBIC AND ANAEROBIC 10CC  Final   Gram Stain   Final    CYTOSPIN SMEAR WBC PRESENT, PREDOMINANTLY MONONUCLEAR NO ORGANISMS SEEN    Culture   Final    NO GROWTH 2 DAYS Performed at [facility]    Report Status PENDING  Incomplete     Studies: Dg Chest Port 1 View  03/08/2015   CLINICAL DATA:  Lymphoma, cough, bilateral chest wall/rib pain  EXAM: PORTABLE CHEST 1 VIEW  COMPARISON:  03/05/2015  FINDINGS: Moderate left pleural effusion.  Small right pleural effusion.  Patchy bilateral lower lobe opacities, likely atelectasis.  No pneumothorax.  Right chest port terminates in the upper right atrium.  The heart is normal in size.  IMPRESSION: Moderate left and small right pleural effusions.  Associated bilateral lower lobe atelectasis.   Electronically Signed   By: Julian Hy M.D.   On: 03/08/2015 09:41     Scheduled Meds: . ampicillin  500 mg Oral 4 times per day  . antiseptic oral rinse  7 mL Mouth Rinse q12n4p  . chlorhexidine  15 mL Mouth Rinse BID  . ipratropium-albuterol  3 mL Nebulization TID  . magic mouthwash w/lidocaine  5 mL Oral TID  . methadone  10 mg Per Tube 3 times per day    Continuous Infusions:    Time spent: 15 minutes  [facility] Pager [phone]. If 7PM-7AM, please contact night-coverage at [email or web address], password [facility] 03/08/2015, 1:09 PM  LOS: 9 days

## 2015-03-08 NOTE — Progress Notes (Signed)
Taking over care of patient. Agree with previous RN assessment. Patient is asleep, will continue to monitor.

## 2015-03-08 NOTE — Clinical Documentation Improvement (Signed)
Internal Medicine  Please clarify the likely etiology of the patient's pleural effusion and link it to a condition if applicable. Please document findings in next progress note and include in discharge summary if clinically significant.   Infectious/bacterial pleural effusion related to ______________  Malignant pleural effusion  Other type of pleural effusion  Clinically Undetermined  Supporting Information:  Paracentesis performed on 9/24 drawing off 1.4 L's; described as slightly hazy, yellow fluid  Please exercise your independent, professional judgment when responding. A specific answer is not anticipated or expected.  Thank You, Zoila Shutter BSN, [place] [phone]

## 2015-03-09 LAB — BASIC METABOLIC PANEL
ANION GAP: 10 (ref 5–15)
BUN: 6 mg/dL (ref 6–20)
CHLORIDE: 98 mmol/L — AB (ref 101–111)
CO2: 25 mmol/L (ref 22–32)
CREATININE: 0.82 mg/dL (ref 0.44–1.00)
Calcium: 8.3 mg/dL — ABNORMAL LOW (ref 8.9–10.3)
GFR calc non Af Amer: >60 mL/min (ref >60)
GLUCOSE: 96 mg/dL (ref 65–99)
Potassium: 3.6 mmol/L (ref 3.5–5.1)
Sodium: 133 mmol/L — ABNORMAL LOW (ref 135–145)

## 2015-03-09 LAB — URINE CULTURE: Culture: NO GROWTH

## 2015-03-09 MED ORDER — METHADONE HCL 10 MG/ML PO CONC: 30.0000 mg | Freq: Every day | ORAL | Status: AC

## 2015-03-09 MED ORDER — GUAIFENESIN-DM 100-10 MG/5ML PO SYRP: 5.0000 mL | ORAL_SOLUTION | ORAL | Status: AC | PRN

## 2015-03-09 MED ORDER — HEPARIN SOD (PORK) LOCK FLUSH 100 UNIT/ML IV SOLN
500.0000 [IU] | INTRAVENOUS | Status: AC | PRN
  Administered 2015-03-09: 500 [IU]

## 2015-03-09 MED ORDER — BENZONATATE 200 MG PO CAPS: 200.0000 mg | ORAL_CAPSULE | Freq: Three times a day (TID) | ORAL | Status: AC | PRN

## 2015-03-09 MED ORDER — IPRATROPIUM-ALBUTEROL 0.5-2.5 (3) MG/3ML IN SOLN: 3.0000 mL | Freq: Three times a day (TID) | RESPIRATORY_TRACT | Status: AC

## 2015-03-09 NOTE — Progress Notes (Signed)
AVS reviewed with patient, signed, and prescriptions given. IV consult team to deaccess port-a-cath. Patient will follow-up with [facility] for daily methadone or with her oncology physician.

## 2015-03-09 NOTE — Discharge Summary (Signed)
Physician Discharge Summary  Joan Hoover CHY:[id number] DOB: 1960-07-22 DOA: 02/27/2015  PCP: Charline Bills, MD  Admit date: 02/27/2015 Discharge date: 03/09/2015  Time spent: 30 minutes  Recommendations for Outpatient Follow-up:  1. Follow up with PCP in one week.  2. Please follow up with oncology as recommended.  3. Follow up with home health RN, PT, .  Discharge Diagnoses:  Principal Problem:   Acute respiratory failure with hypoxia Active Problems:   Neutropenia   Septic shock   HCAP (healthcare-associated pneumonia)   Hypokalemia   Hypomagnesemia   Thrombocytopenia   Pleural effusion, left   Adrenal insufficiency   Anemia of chronic disease   Acute renal failure   Heroin use   IV drug abuse   Patient on methadone maintenance therapy   Diffuse large B cell lymphoma   Pressure ulcer stage II   Discharge Condition: improved  Diet recommendation: regular diet.   Filed Weights   03/07/15 0533 03/08/15 0613 03/09/15 0500  Weight: 57 kg (125 lb 10.6 oz) 55.7 kg (122 lb 12.7 oz) 50.4 kg (111 lb 1.8 oz)    History of present illness:  Patient is a 54 year old female past oral history of IV heroin abuse now on methadone & recent diagnosis of lymphoma on chemotherapy who was admitted on 9/24 nausea vomiting and abdominal pain and found to have acute respiratory failure requiring intubation. Initial CT scan noted large pleural effusions and patient felt to be in septic shock from healthcare associated pneumonia requiring pressor and ventilator support. Patient able to be extubated 9/21. Patient's urinary antigen is positive for Streptococcus. Transitioned from Zosyn to by mouth antibiotics. Patient underwent thoracentesis done 9/26 removing 1.6 L both for diagnostic and therapeutic purposes. To date, no significant growth or cytology.   Hospital Course:  Acute respiratory failure with hypoxia secondary to septic shock from Streptococcus healthcare associated pneumonia and  secondary bilateral pleural effusions: Patient met criteria on admission for sepsis from leukocytosis, lactic acidosis, respiratory failure, tachypnea, tachycardia and source being pulmonary. Initially intubated now off of ventilator. She also underwent thoracocentensis . Pleural analysis shows 178 of wbc's and the cultures are negative so far.  She has completed 11 days of IV antibiotics.     Active Problems:   Hypokalemia: Continuing to replace. Unclear why she keeps dropping her potassium. Repeat in am is normal.    Hypomagnesemia: Also low, replaced today, repeat in am is normal.    Thrombocytopenia/neutropenia/anemia of chronic disease: Felt to be secondary to lymphoma. Currently white count normal with stable hemoglobin and platelets.   Adrenal insufficiency: Stable   IV drug abuse: On methadone. Stopped prn fentanyl. Recommend resuming the methadone on discharge.    Diffuse large B cell lymphoma: On treatment with [facility] as of 02/19/15 (HIV neg 8/19). Outpatient follow up with oncology as recommended.    Pressure ulcer stage II: Seen by wound care, recommendations made  Left pleural effusion:S/p thoracocentesis, probably a transudate, of unclear etiology.  pleural fluid protein less than 3. LDH is 81. Gram stain was not obtained. PH was not obtained.  So fall cultures have been negative.    Fever on 9/28: Repeat CXR does not show any consolidation. Repeat UA appears abnormal, but she denies any dysuria. Cultures are negative.   Procedures:  Echo done 8/78:MVEHM 1 diastolic dysfxn  Thoracentesis done 9/26:1.4 L removed)  Consultations:  Critical care.   Discharge Exam: Filed Vitals:   03/09/15 0636  BP: 116/71  Pulse: 99  Temp: 98.2 F (36.8  C)  Resp: 18    General: alert afebrile comfortable Cardiovascular: s1s2 Respiratory: ctab  Discharge Instructions    Current Discharge Medication List    START taking these medications   Details   benzonatate (TESSALON) 200 MG capsule Take 1 capsule (200 mg total) by mouth 3 (three) times daily as needed for cough. Qty: 20 capsule, Refills: 0    guaiFENesin-dextromethorphan (ROBITUSSIN DM) 100-10 MG/5ML syrup Take 5 mLs by mouth every 4 (four) hours as needed for cough. Qty: 118 mL, Refills: 0    ipratropium-albuterol (DUONEB) 0.5-2.5 (3) MG/3ML SOLN Take 3 mLs by nebulization 3 (three) times daily. Qty: 360 mL, Refills: 1    methadone (DOLOPHINE) 10 MG/ML solution Take 3-6 mLs (30-60 mg total) by mouth daily. Refills: 0      CONTINUE these medications which have NOT CHANGED   Details  allopurinol (ZYLOPRIM) 300 MG tablet Take 300 mg by mouth daily. Refills: 0    HYDROcodone-acetaminophen (NORCO) 10-325 MG per tablet Take 1 tablet by mouth every 6 (six) hours as needed (for pain.). For pain. Refills: 0    NON FORMULARY Chemo Dr. Regis Bill office    ondansetron (ZOFRAN) 4 MG tablet Take 8 mg by mouth every 8 (eight) hours as needed. For nausea. Refills: 0    pantoprazole (PROTONIX) 40 MG tablet Take 40 mg by mouth 2 (two) times daily. Refills: 0    polyethylene glycol (MIRALAX / GLYCOLAX) packet Take 17 g by mouth daily as needed. For constipation. Refills: 0    predniSONE (DELTASONE) 50 MG tablet Take 100 mg by mouth as directed. 100 mg daily on days 2-5 of week of chemo only    sennosides-docusate sodium (SENOKOT-S) 8.6-50 MG tablet Take 1 tablet by mouth daily.      STOP taking these medications     methadone (DOLOPHINE) 10 MG/5ML solution      Oxycodone HCl 10 MG TABS        No Known Allergies Follow-up Information    Follow up with HAYES,ANTHONY, MD. Schedule an appointment as soon as possible for a visit on 03/05/2015.   Specialty:  Family Medicine   Contact information:   [address] [place] [address] [place] [address] [phone]        The results of significant diagnostics from this hospitalization (including imaging, microbiology,  ancillary and laboratory) are listed below for reference.    Significant Diagnostic Studies: Ct Abdomen Pelvis Wo Contrast  02/27/2015   CLINICAL DATA:  Nausea and abdominal pain. Hypoxia, metabolic acidosis. Lymphoma.  EXAM: CT ABDOMEN AND PELVIS WITHOUT CONTRAST  TECHNIQUE: Multidetector CT imaging of the abdomen and pelvis was performed following the standard protocol without IV contrast.  COMPARISON:  11/17/2008  FINDINGS: Lower chest: Large left and moderate right pleural effusions with adjacent airspace opacity which is mostly due to passive atelectasis. Difficult to exclude infrahilar right adenopathy.  Low-density blood pool suggests anemia. Nasogastric tube enters the stomach.  Hepatobiliary: Gallbladder wall thickening. High density in the gallbladder may be sludge.  Pancreas: Unremarkable  Spleen: Unremarkable  Adrenals/Urinary Tract: Foley catheter in the urinary bladder. No stones or hydronephrosis. Bilateral perirenal stranding but not disproportionate to the degree of subcutaneous and mesenteric edema.  Stomach/Bowel: No dilated bowel. Potentially some bowel wall thickening along a long segment of the sigmoid colon.  Vascular/Lymphatic: No significant atherosclerotic calcification.  There is retroperitoneal adenopathy including a left periaortic node measuring 1.2 cm on image 28 series 2; a retrocaval node measuring 1.1 cm in short  axis on image 31 series 2; a left common iliac node measuring 1.5 cm in short axis on image 47 series 2; and a left external iliac node measuring 1.0 cm in short axis on image 64 series 2.  Postoperative findings in the right inguinal region are likely from a recent right inguinal lymph node biopsy.  Reproductive: Unremarkable  Other: Small amount of pelvic ascites. Diffuse mesenteric, subcutaneous, and retroperitoneal edema indicating third spacing of fluid.  Musculoskeletal: Insufficiency fractures of both sacral ala. Vertically oriented fracture of the right pubic  body and adjacent superior and inferior rami, with adjacent heterotopic ossification extending along the right hip adductor musculature.  On the prior exam from 11/17/2008, there was a superior endplate compression fracture at T12. Today this is stable, but there are also superior endplate compression fractures at T11, L1, L2, L 3, L4, and L5, along with an inferior endplate compression fracture at L3.  IMPRESSION: 1. Large left and moderate right pleural effusions with adjacent airspace opacities mostly due to passive atelectasis. 2. Considerable third spacing of fluid in the mesentery, retroperitoneum, and subcutaneous tissues. Small amount of pelvic ascites. 3. Low-density blood pool favoring anemia. 4. Retroperitoneal and left pelvic adenopathy is mild but may be a manifestation of the patient's reported lymphoma. There is evidence of a recent all right inguinal excisional biopsy with clips in place. 5. Compared to 2010 there are new endplate compression fractures at all lumbar levels and at the T11 level. There also insufficiency fractures of both sacral ala as well as a vertical fracture of the right pubic body and adjacent pubic rami, with adjacent heterotopic ossification in the right hip adductor musculature indicating likely late subacute chronicity. 6. There is some mild bowel wall thickening along a long segment of sigmoid colon. The possibility of low-grade colitis is not excluded.   Electronically Signed   By: Van Clines M.D.   On: 02/27/2015 15:56   Dg Chest 1 View  03/05/2015   CLINICAL DATA:  Status post left-sided thoracentesis.  EXAM: CHEST 1 VIEW  COMPARISON:  March 04, 2015.  FINDINGS: Stable cardiomediastinal silhouette. No pneumothorax is noted. Left-sided pleural effusion is significantly smaller status post thoracentesis. Mild right pleural effusion remains. Right-sided Port-A-Cath is unchanged in position.  IMPRESSION: No pneumothorax status post left-sided thoracentesis.  Left pleural effusion is significantly smaller.   Electronically Signed   By: Marijo Conception, M.D.   On: 03/05/2015 13:42   Dg Abd 1 View  02/27/2015   CLINICAL DATA:  Shortness of breath. Recurrent pleural effusions. ET tube and OG tube placement.  EXAM: ABDOMEN - 1 VIEW  COMPARISON:  None.  FINDINGS: Tip of the enteric tube is seen in the proximal to mid stomach. Gas within mildly prominent mid and low lower abdominal small bowel loops. No free air organomegaly.  IMPRESSION: OG tube tip in the proximal to mid stomach.   Electronically Signed   By: Rolm Baptise M.D.   On: 02/27/2015 09:25   US Renal  02/27/2015   CLINICAL DATA:  54 year old female with renal failure  EXAM: RENAL / URINARY TRACT ULTRASOUND COMPLETE  COMPARISON:  Concurrently obtained CT scan of the abdomen and pelvis  FINDINGS: Right Kidney:  Length: 10.6 cm. Echogenic renal parenchyma with increased conspicuity of the corticomedullary junction. No mass or hydronephrosis visualized.  Left Kidney:  Length: 12.2 cm. Echogenic renal parenchyma with increased conspicuity of the corticomedullary junction. No mass or hydronephrosis visualized.  Bladder:  Decompressed with Foley catheter  in place.  Other:  Bilateral pleural effusions noted incidentally.  IMPRESSION: 1. Echogenic kidneys bilaterally suggests underlying medical renal disease. 2. No evidence of hydronephrosis. 3. Foley catheter in place. 4. Bilateral pleural effusions.   Electronically Signed   By: Jacqulynn Cadet M.D.   On: 02/27/2015 20:56   [place] Art/ven Flow Abd Pelv Doppler  03/02/2015   CLINICAL DATA:  Ascites.  Acute right upper quadrant abdominal pain.  EXAM: DUPLEX ULTRASOUND OF LIVER  TECHNIQUE: Color and duplex Doppler ultrasound was performed to evaluate the hepatic in-flow and out-flow vessels.  COMPARISON:  CT scan of February 27, 2015.  FINDINGS: Portal Vein Velocities  Main:  44.4 cm/sec  Right:  35.6 cm/sec  Left:  23.6 cm/sec  Hepatopetal flow is noted in the  portal vessels.  Hepatic Vein Velocities  Right:  28.9 cm/sec  Middle:  37.0 cm/sec  Left:  Could not obtain waveform due to limitations of study.  Hepatofugal flow is noted in the visualized hepatic veins.  Hepatic Artery Velocity:  68 cm/sec  Splenic Vein Velocity:  33.3 cm/sec  Varices: Absent.  Ascites: Present.  There is no evidence of portal or splenic venous occlusion or thrombus. Distended gallbladder is noted with a small amount of sludge, but no stones are noted. No gallbladder wall thickening or pericholecystic fluid is noted. No sonographic Percell Miller sign is noted. Common bile duct measures 5.2 mm in diameter which is within normal limits. No focal abnormality is noted within the liver. Parenchyma is grossly normal in echogenicity.  IMPRESSION: There is no evidence of portal, hepatic or splenic thrombosis or occlusion. Small amount of sludge is noted and distended gallbladder, but no other significant abnormality seen in the right upper quadrant of the abdomen. Small amount of ascites is noted.   Electronically Signed   By: Marijo Conception, M.D.   On: 03/02/2015 07:45   Dg Chest Port 1 View  03/08/2015   CLINICAL DATA:  Lymphoma, cough, bilateral chest wall/rib pain  EXAM: PORTABLE CHEST 1 VIEW  COMPARISON:  03/05/2015  FINDINGS: Moderate left pleural effusion.  Small right pleural effusion.  Patchy bilateral lower lobe opacities, likely atelectasis.  No pneumothorax.  Right chest port terminates in the upper right atrium.  The heart is normal in size.  IMPRESSION: Moderate left and small right pleural effusions.  Associated bilateral lower lobe atelectasis.   Electronically Signed   By: Julian Hy M.D.   On: 03/08/2015 09:41   Dg Chest Port 1 View  03/04/2015   CLINICAL DATA:  54 year old female with shortness of breath. History of smoking and COPD.  EXAM: PORTABLE CHEST 1 VIEW  COMPARISON:  Chest x-ray 03/03/2015.  FINDINGS: Large bilateral pleural effusions (left greater than right) with  associated bibasilar areas of atelectasis and/or consolidation. No cephalization of the pulmonary vasculature to suggests pulmonary edema. Cardiac silhouette is largely obscured, but appears likely normal in size. Upper mediastinal contours are within normal limits. Right internal jugular single-lumen porta cath with tip terminating at the superior cavoatrial junction.  IMPRESSION: 1. Large bilateral pleural effusions (left greater than right) with areas of atelectasis and/or consolidation in the lung bases bilaterally, similar to prior study.   Electronically Signed   By: Vinnie Langton M.D.   On: 03/04/2015 15:06   Dg Chest Port 1 View  03/03/2015   CLINICAL DATA:  Respiratory failure  EXAM: PORTABLE CHEST 1 VIEW  COMPARISON:  03/02/2015  FINDINGS: Right Port-A-Cath remains in place, unchanged. Moderate to large pleural  effusions, left greater than right again noted. Bilateral airspace disease in the mid and upper lungs stable. Heart difficult to visualize due to the effusions, likely upper limits normal in size. No acute bony abnormality.  IMPRESSION: Stable bilateral effusions and bilateral airspace disease which could represent edema, infection or atelectasis.   Electronically Signed   By: Rolm Baptise M.D.   On: 03/03/2015 09:11   Dg Chest Port 1 View  03/02/2015   CLINICAL DATA:  Pleural effusion.  EXAM: PORTABLE CHEST 1 VIEW  COMPARISON:  03/01/2015.  FINDINGS: Port-A-Cath in stable position. Left IJ line has been removed. Heart size stable. Persistent bilateral pleural effusions. Pulmonary infiltrates cannot be excluded. No pneumothorax. Surgical clips right chest.  IMPRESSION: 1. Interim removal of left IJ line. Right Port-A-Cath in stable position. 2. Persistent unchanged pleural effusions. Underlying basilar infiltrates cannot be excluded. Heart size stable.   Electronically Signed   By: Marcello Moores  Register   On: 03/02/2015 07:21   Dg Chest Port 1 View  03/01/2015   CLINICAL DATA:  Respiratory  failure.  Pleural effusion.  EXAM: PORTABLE CHEST - 1 VIEW  COMPARISON:  02/28/2015  FINDINGS: Endotracheal tube and nasogastric tube within removed.  Left IJ line tip: SVC. Right Port-A-Cath tip: Cavoatrial junction.  Hazy opacities overlie both lungs, with obscuration of both hemidiaphragms suggesting large layering pleural effusions. Accounting for the hazy density that this causes overlying both lung fields, there also appears to be some interstitial accentuation for example in the right lung apex. Cardiac borders are somewhat obscured but my sense is that there is no overt cardiomegaly.  IMPRESSION: 1. Large left and potentially moderate or large right pleural effusion layering with obscuration of the hemidiaphragms. The degree of right hemidiaphragmatic obscuration is increased. There could be some underlying interstitial edema or interstitial accentuation, but no discrete cardiomegaly is identified. 2. Endotracheal and nasogastric tubes have been removed.   Electronically Signed   By: Van Clines M.D.   On: 03/01/2015 07:33   Dg Chest Port 1 View  02/28/2015   CLINICAL DATA:  Respiratory failure.  EXAM: PORTABLE CHEST - 1 VIEW  COMPARISON:  02/27/2015.  FINDINGS: Endotracheal tube tip 1.3 cm above the carina. Proximal repositioning of approximately 2 cm suggested. Bilateral IJ lines in stable position. NG tube in stable position. Heart size normal. Low lung volumes with basilar atelectasis. Progressive left-sided pleural effusion. Pleural fluid collection over the left upper chest cannot be excluded. Left upper lobe atelectasis and/or infiltrate cannot be excluded. Mild infiltrate noted in the right upper lobe. No pneumothorax.  IMPRESSION: 1. Endotracheal tube tip 1.3 cm above the carina. Proximal repositioning of approximately 2 cm suggested. Bilateral IJ lines in stable position. 2. Progressive left-sided pleural effusion. Fluid fluid collection in the left upper chest cannot be excluded. Left  upper lobe atelectasis and/or infiltrate could cannot be excluded. Persistent bibasilar atelectasis. Mild right upper lobe infiltrate.   Electronically Signed   By: Marcello Moores  Register   On: 02/28/2015 08:06   Dg Chest Port 1 View  02/27/2015   CLINICAL DATA:  Left internal jugular line placement.  EXAM: PORTABLE CHEST - 1 VIEW  COMPARISON:  02/27/2015  FINDINGS: Left central line has been placed with the tip in the SVC. No pneumothorax. Endotracheal tube, right Port-A-Cath and NG tube are unchanged.  Moderate left pleural effusion, similar to prior study. Left upper lobe atelectasis or infiltrate again noted. Small right pleural effusion. Mild perihilar and lower lobe opacities could reflect edema.  IMPRESSION: Moderate left and small right effusion, similar to prior study. Left upper lobe atelectasis or infiltrate.  Left central line tip in the SVC.  No pneumothorax.   Electronically Signed   By: Rolm Baptise M.D.   On: 02/27/2015 13:00   Dg Chest Port 1 View  02/27/2015   CLINICAL DATA:  Shortness of breath.  EXAM: PORTABLE CHEST - 1 VIEW  COMPARISON:  February 27, 2015  FINDINGS: The heart size and mediastinal contours are stable. Endotracheal tube is identified distal tip 2 cm from carina, retraction by 2 cm is recommended. There is no pneumothorax. A right central venous line is identified distal tip in the superior vena cava/ right atrial junction. A nasogastric tube is identified with distal tip not included on film but is at least in the stomach. There are small bilateral pleural effusions. There is pulmonary edema. Patchy consolidation of left lung base is identified unchanged. There is increased patchy consolidation of left upper lobe. The visualized skeletal structures are unremarkable.  IMPRESSION: Endotracheal tube as described. Retraction by 2 cm is recommended. There is no pneumothorax.  Nasogastric tube tip is not included on the film but is at least in the stomach.  Bilateral pleural effusions.   Pulmonary edema. Patchy consolidations of the left lung, at least in part due to atelectasis but superimposed pneumonia is not excluded.   Electronically Signed   By: Abelardo Diesel M.D.   On: 02/27/2015 09:28   Dg Chest Portable 1 View  02/27/2015   CLINICAL DATA:  Shortness of breath  EXAM: PORTABLE CHEST - 1 VIEW  COMPARISON:  None.  FINDINGS: Cardiomegaly with pulmonary vascular congestion. No frank interstitial edema.  Mild patchy left lower lobe opacity, possibly atelectasis.  Small to moderate left pleural effusion.  No pneumothorax.  Right chest port terminates in the upper right atrium.  IMPRESSION: Cardiomegaly with pulmonary vascular congestion. No frank interstitial edema.  Small to moderate left pleural effusion.  Mild patchy left lower lobe opacity, possibly atelectasis.   Electronically Signed   By: Julian Hy M.D.   On: 02/27/2015 06:25   US Abdomen Limited Ruq  03/02/2015   CLINICAL DATA:  Ascites.  Acute right upper quadrant abdominal pain.  EXAM: DUPLEX ULTRASOUND OF LIVER  TECHNIQUE: Color and duplex Doppler ultrasound was performed to evaluate the hepatic in-flow and out-flow vessels.  COMPARISON:  CT scan of February 27, 2015.  FINDINGS: Portal Vein Velocities  Main:  44.4 cm/sec  Right:  35.6 cm/sec  Left:  23.6 cm/sec  Hepatopetal flow is noted in the portal vessels.  Hepatic Vein Velocities  Right:  28.9 cm/sec  Middle:  37.0 cm/sec  Left:  Could not obtain waveform due to limitations of study.  Hepatofugal flow is noted in the visualized hepatic veins.  Hepatic Artery Velocity:  68 cm/sec  Splenic Vein Velocity:  33.3 cm/sec  Varices: Absent.  Ascites: Present.  There is no evidence of portal or splenic venous occlusion or thrombus. Distended gallbladder is noted with a small amount of sludge, but no stones are noted. No gallbladder wall thickening or pericholecystic fluid is noted. No sonographic Percell Miller sign is noted. Common bile duct measures 5.2 mm in diameter which is  within normal limits. No focal abnormality is noted within the liver. Parenchyma is grossly normal in echogenicity.  IMPRESSION: There is no evidence of portal, hepatic or splenic thrombosis or occlusion. Small amount of sludge is noted and distended gallbladder, but no other significant abnormality seen in  the right upper quadrant of the abdomen. Small amount of ascites is noted.   Electronically Signed   By: Marijo Conception, M.D.   On: 03/02/2015 07:45   US Thoracentesis Asp Pleural Space W/img Guide  03/05/2015   INDICATION: Lymphoma, dyspnea, pneumonia, bilateral pleural effusions, left greater than right. Request is made for diagnostic and therapeutic left thoracentesis.  EXAM: ULTRASOUND GUIDED DIAGNOSTIC AND THERAPEUTIC LEFT THORACENTESIS  COMPARISON:  None.  MEDICATIONS: None  COMPLICATIONS: None immediate  TECHNIQUE: Informed written consent was obtained from the patient after a discussion of the risks, benefits and alternatives to treatment. A timeout was performed prior to the initiation of the procedure.  Initial ultrasound scanning demonstrates a moderate-to-large left pleural effusion. The lower chest was prepped and draped in the usual sterile fashion. 1% lidocaine was used for local anesthesia.  An ultrasound image was saved for documentation purposes. A 6 Fr Safe-T-Centesis catheter was introduced. The thoracentesis was performed. The catheter was removed and a dressing was applied. The patient tolerated the procedure well without immediate post procedural complication. The patient was escorted to have an upright chest radiograph.  FINDINGS: A total of approximately 1.4 liters of slightly hazy, yellow fluid was removed. Requested samples were sent to the laboratory. Only the above amount of fluid was removed at this time secondary to patient coughing/chest discomfort.  IMPRESSION: Successful ultrasound-guided diagnostic and therapeutic left sided thoracentesis yielding 1.4 liters of pleural  fluid.  Read by: Rowe Robert, PA-C   Electronically Signed   By: Lucrezia Europe M.D.   On: 03/05/2015 13:30    Microbiology: Recent Results (from the past 240 hour(s))  Culture, respiratory (tracheal aspirate)     Status: None   Collection Time: 02/28/15  6:13 AM  Result Value Ref Range Status   Specimen Description TRACHEAL ASPIRATE  Final   Special Requests Immunocompromised  Final   Gram Stain   Final    FEW WBC PRESENT,BOTH PMN AND MONONUCLEAR NO SQUAMOUS EPITHELIAL CELLS SEEN NO ORGANISMS SEEN Performed at [facility]    Culture   Final    FEW CANDIDA ALBICANS Performed at [facility]    Report Status 03/03/2015 FINAL  Final  Culture, body fluid-bottle     Status: None (Preliminary result)   Collection Time: 03/05/15  1:16 PM  Result Value Ref Range Status   Specimen Description FLUID PLEURAL  Final   Special Requests BOTTLES DRAWN AEROBIC AND ANAEROBIC 10CC  Final   Culture   Final    NO GROWTH 4 DAYS Performed at [facility]    Report Status PENDING  Incomplete  Gram stain     Status: None   Collection Time: 03/05/15  1:16 PM  Result Value Ref Range Status   Specimen Description FLUID PLEURAL  Final   Special Requests NONE  Final   Gram Stain   Final    CYTOSPIN SMEAR WBC PRESENT, PREDOMINANTLY MONONUCLEAR NO ORGANISMS SEEN Performed at [facility]    Report Status 03/08/2015 FINAL  Final  Culture, Urine     Status: None (Preliminary result)   Collection Time: 03/08/15  2:49 PM  Result Value Ref Range Status   Specimen Description URINE, CLEAN CATCH  Final   Special Requests NONE  Final   Culture   Final    NO GROWTH < 24 HOURS Performed at [facility]    Report Status PENDING  Incomplete     Labs: Basic Metabolic Panel:  Recent Labs  Lab 03/03/15 0500 03/04/15 0800 03/06/15 0455 03/07/15 1027 03/08/15 1420 03/09/15 1044  NA 139 135 134* 131* 129* 133*  K 3.0* 2.6* 2.4* 4.2 3.8 3.6  CL 97* 91* 90* 97* 97*  98*  CO2 33* _0 GLUCOSE 63* 46* 45* 96 95 96  BUN _1 <5* 6 6  CREATININE 1.05* 1.17* 0.90 0.86 0.90 0.82  CALCIUM 8.2* 8.0* 7.7* 8.3* 8.2* 8.3*  MG 1.6*  --  1.5* 1.7 1.8  --   PHOS 2.1*  --   --   --  3.2  --    Liver Function Tests: No results for input(s): AST, ALT, ALKPHOS, BILITOT, PROT, ALBUMIN in the last 168 hours. No results for input(s): LIPASE, AMYLASE in the last 168 hours. No results for input(s): AMMONIA in the last 168 hours. CBC:  Recent Labs Lab 03/03/15 0500 03/04/15 0800  WBC 4.7 9.7  HGB 9.9* 10.6*  HCT 31.8* 33.6*  MCV 85.9 84.4  PLT 84* 149*   Cardiac Enzymes: No results for input(s): CKTOTAL, CKMB, CKMBINDEX, TROPONINI in the last 168 hours. BNP: BNP (last 3 results)  Recent Labs  02/27/15 0545  BNP 176.5*    ProBNP (last 3 results) No results for input(s): PROBNP in the last 8760 hours.  CBG:  Recent Labs Lab 03/07/15 0942  GLUCAP 99       Signed:  AKULA,VIJAYA  [facility] 03/09/2015, 2:04 PM

## 2015-03-10 LAB — CULTURE, BODY FLUID W GRAM STAIN -BOTTLE: Culture: NO GROWTH

## 2016-05-12 IMAGING — CR DG CHEST 1V PORT
1 series · 1 of 1 positions shown · non-contrast
Comparison: 03/05/2015

CLINICAL DATA: Lymphoma, cough, bilateral chest wall/rib pain

EXAM:
PORTABLE CHEST 1 VIEW

[AP]
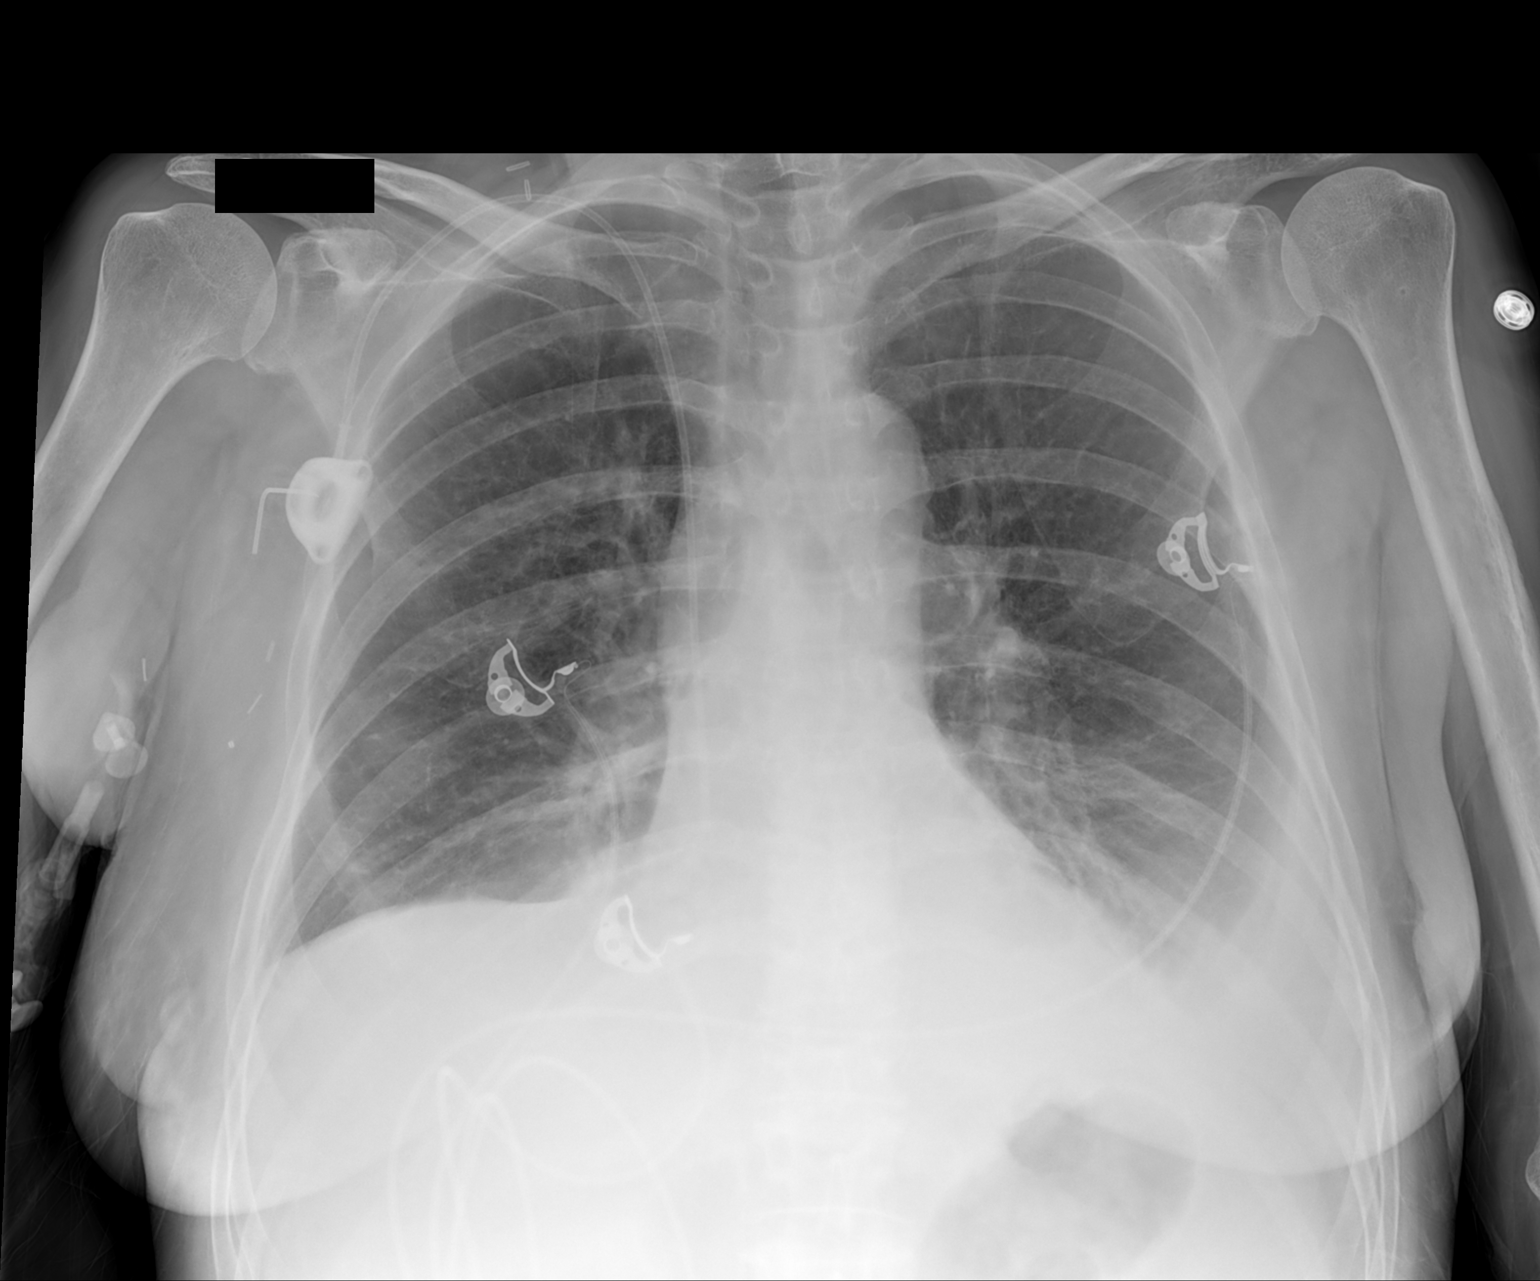

[1 of 1 positions shown; findings below may reference images not displayed]

FINDINGS: Moderate left pleural effusion.  Small right pleural effusion.

Patchy bilateral lower lobe opacities, likely atelectasis.

No pneumothorax.

Right chest port terminates in the upper right atrium.

The heart is normal in size.
IMPRESSION: Moderate left and small right pleural effusions.

Associated bilateral lower lobe atelectasis.

## 2016-08-07 DEATH — deceased
